# Patient Record
Sex: Male | Born: 1960 | Race: Black or African American | Hispanic: No | Marital: Single | Smoking: Never smoker
Health system: Southern US, Community
[De-identification: ages and names within clinical notes are randomized; demographics above are authoritative.]

## PROBLEM LIST (undated history)

## (undated) DIAGNOSIS — N189 Chronic kidney disease, unspecified: Secondary | ICD-10-CM

## (undated) DIAGNOSIS — I10 Essential (primary) hypertension: Secondary | ICD-10-CM

## (undated) DIAGNOSIS — R7303 Prediabetes: Secondary | ICD-10-CM

## (undated) DIAGNOSIS — M1990 Unspecified osteoarthritis, unspecified site: Secondary | ICD-10-CM

## (undated) DIAGNOSIS — E349 Endocrine disorder, unspecified: Secondary | ICD-10-CM

## (undated) DIAGNOSIS — S76319A Strain of muscle, fascia and tendon of the posterior muscle group at thigh level, unspecified thigh, initial encounter: Secondary | ICD-10-CM

## (undated) DIAGNOSIS — N289 Disorder of kidney and ureter, unspecified: Secondary | ICD-10-CM

## (undated) HISTORY — DX: Strain of muscle, fascia and tendon of the posterior muscle group at thigh level, unspecified thigh, initial encounter: S76.319A

## (undated) HISTORY — DX: Disorder of kidney and ureter, unspecified: N28.9

## (undated) HISTORY — DX: Unspecified osteoarthritis, unspecified site: M19.90

## (undated) HISTORY — DX: Endocrine disorder, unspecified: E34.9

## (undated) HISTORY — PX: NO PAST SURGERIES: SHX2092

## (undated) HISTORY — DX: Essential (primary) hypertension: I10

---

## 2019-09-02 ENCOUNTER — Other Ambulatory Visit: Payer: Self-pay

## 2019-09-03 ENCOUNTER — Ambulatory Visit: Payer: 59 | Admitting: Family Medicine

## 2019-09-03 ENCOUNTER — Encounter: Payer: Self-pay | Admitting: Family Medicine

## 2019-09-03 VITALS — BP 130/80 | HR 81 | Temp 98.0°F | Ht 68.0 in | Wt 186.2 lb

## 2019-09-03 DIAGNOSIS — Z1211 Encounter for screening for malignant neoplasm of colon: Secondary | ICD-10-CM

## 2019-09-03 DIAGNOSIS — Z23 Encounter for immunization: Secondary | ICD-10-CM

## 2019-09-03 DIAGNOSIS — I10 Essential (primary) hypertension: Secondary | ICD-10-CM | POA: Insufficient documentation

## 2019-09-03 DIAGNOSIS — R7989 Other specified abnormal findings of blood chemistry: Secondary | ICD-10-CM | POA: Insufficient documentation

## 2019-09-03 MED ORDER — AMLODIPINE BESYLATE 10 MG PO TABS: 10.0000 mg | ORAL_TABLET | Freq: Every day | ORAL | 1 refills | Status: DC

## 2019-09-03 NOTE — Patient Instructions (Signed)
Consider looking into xyosted testosterone injections. Can visit website to learn more about this and look into savings coupon.

## 2019-09-03 NOTE — Progress Notes (Signed)
Max [name] DOB: [birth year] Encounter date: 09/03/2019  This isa 59 y.o. male who presents to establish care. Chief Complaint  Patient presents with  . Establish Care    History of present illness: Sees provider in [place] who is NP and retiring soon; so is here to establish care.  Does testosterone injections weekly from [facility].   Has harder time in summer with allergies - starts coughing/chest congestion. Used albuterol and flonase and those have helped.   Has been on amlodipine 10mg  daily x 4-5 years. No swelling in legs.   Has torn hamstring and tens is helping with this. Had to stop taking anti-inflammatory due to effects on kidney. Taking tylenol if needed for pain.  Injecting self with testosterone. Didn't get benefit with topical cream testosterone treatment.    Does feel like testosterone helps with energy levels. Has long shifts, works nights. When it wears off he feels more tired.   Drinks little coffee in morning; tries to drink a lot of water. Has had a couple of episodes of dehydration in the past with syncope so really works on this now.   Never had colonoscopy completed. Not sure about last tetanus shot.   Past Medical History:  Diagnosis Date  . Abnormal renal function    states labs were elevated due to taking ANSAIDS-he is treated by a urologist in [place]  . Arthritis   . Hamstring tear    left  . Hypertension   . Testosterone deficiency    treated by [facility] [place]   History reviewed. No pertinent surgical history. Allergies  Allergen Reactions  . Ibuprofen     Affects kidney function significantly   Current Meds  Medication Sig  . acetaminophen (TYLENOL) 500 MG tablet Take 500 mg by mouth as needed.  [facility] amLODipine (NORVASC) 10 MG tablet Take 1 tablet (10 mg total) by mouth daily.  . cholecalciferol (VITAMIN D3) 25 MCG (1000 UNIT) tablet Take 1,000 Units by mouth daily.  . fluticasone (FLONASE) 50 MCG/ACT nasal spray Place into  both nostrils daily.  [facility] PRESCRIPTION MEDICATION Testosterone injection-self administered once a week  . tadalafil (CIALIS) 5 MG tablet Take 5 mg by mouth daily.  . traZODone (DESYREL) 50 MG tablet Take 50-100 mg by mouth at bedtime as needed.  . [DISCONTINUED] amLODipine (NORVASC) 10 MG tablet Take 10 mg by mouth daily.   Social History   Tobacco Use  . Smoking status: Never Smoker  . Smokeless tobacco: Never Used  Substance Use Topics  . Alcohol use: Not Currently    Comment: quit at age 24   Family History  Problem Relation Age of Onset  . Hypertension Mother   . Hypertension Father   . Bowel Disease Father        obstruction  . Diabetes Sister   . Hypertension Sister   . Hypertension Sister      Review of Systems  Constitutional: Negative for chills, fatigue and fever.  Respiratory: Negative for cough, chest tightness, shortness of breath and wheezing.   Cardiovascular: Negative for chest pain, palpitations and leg swelling.    Objective:  BP 130/80 (BP Location: Right Arm, Patient Position: Sitting, Cuff Size: Normal)   Pulse 81   Temp 98 F (36.7 C) (Temporal)   Ht 5\' 8"  (1.727 m)   Wt 186 lb 3.2 oz (84.5 kg)   SpO2 96%   BMI 28.31 kg/m   Weight: 186 lb 3.2 oz (84.5 kg)   BP Readings from Last 3  Encounters:  09/03/19 130/80   Wt Readings from Last 3 Encounters:  09/03/19 186 lb 3.2 oz (84.5 kg)    Physical Exam Constitutional:      General: He is not in acute distress.    Appearance: He is well-developed.  Cardiovascular:     Rate and Rhythm: Normal rate and regular rhythm.     Heart sounds: Normal heart sounds. No murmur. No friction rub.  Pulmonary:     Effort: Pulmonary effort is normal. No respiratory distress.     Breath sounds: Normal breath sounds. No wheezing or rales.  Musculoskeletal:     Right lower leg: No edema.     Left lower leg: No edema.  Neurological:     Mental Status: He is alert and oriented to person, place, and time.   Psychiatric:        Behavior: Behavior normal.     Assessment/Plan: 1. Essential hypertension Well controlled.   2. Low testosterone Getting shots through [facility].   Tdap completed today since due Return for pending record review.  Micheline Rough, MD

## 2019-09-06 ENCOUNTER — Telehealth: Payer: 59 | Admitting: Family Medicine

## 2019-09-10 ENCOUNTER — Encounter: Payer: Self-pay | Admitting: Family Medicine

## 2019-09-10 ENCOUNTER — Telehealth (INDEPENDENT_AMBULATORY_CARE_PROVIDER_SITE_OTHER): Payer: 59 | Admitting: Family Medicine

## 2019-09-10 DIAGNOSIS — J4520 Mild intermittent asthma, uncomplicated: Secondary | ICD-10-CM | POA: Diagnosis not present

## 2019-09-10 DIAGNOSIS — R7989 Other specified abnormal findings of blood chemistry: Secondary | ICD-10-CM | POA: Diagnosis not present

## 2019-09-10 DIAGNOSIS — J45909 Unspecified asthma, uncomplicated: Secondary | ICD-10-CM | POA: Insufficient documentation

## 2019-09-10 MED ORDER — SPACER/AERO-HOLDING CHAMBERS DEVI
1.0000 | Freq: Every day | 0 refills | Status: DC | PRN
Start: 2019-09-10 — End: 2022-01-18

## 2019-09-10 NOTE — Progress Notes (Signed)
Virtual Visit via Video Note  I connected with Max [name]  on 09/10/19 at 10:30 AM EDT by a video enabled telemedicine application and verified that I am speaking with the correct person using two identifiers.  Location patient: home Location provider:work or home office Persons participating in the virtual visit: patient, provider  I discussed the limitations of evaluation and management by telemedicine and the availability of in person appointments. The patient expressed understanding and agreed to proceed.   Max [name] DOB: July 10, 1960 Encounter date: 09/10/2019  This is a 59 y.o. male who presents with Chief Complaint  Patient presents with  . Cough    non productive x2 months, states ge feels like this every year due to allergies, used codeine type cough syrup he had from last year    History of present illness: Cough not as bad, but still dry cough. Has been using albuterol 2 puffs just if needed. Does feel like sx are getting better overall. No fevers, chills. Has used albuterol intermittently in the past usually during allergy season to help with recurring cough.  Was using old codeine cough syrup.  This was helpful, but he does not feel that he needs this anymore.  Never been on controller inhaler in the past.  Occasionally feels that he is somewhat wheezy.  Not significantly short of breath.  Not been dx with asthma in past. More notes with trying to laugh. Never had lung function testing.     Allergies  Allergen Reactions  . Ibuprofen     Affects kidney function significantly   No outpatient medications have been marked as taking for the 09/10/19 encounter (Video Visit) with Caren Macadam, MD.    Review of Systems  Constitutional: Negative for chills, fatigue and fever.  Respiratory: Negative for cough, chest tightness, shortness of breath and wheezing.   Cardiovascular: Negative for chest pain, palpitations and leg swelling.     Objective:  There were no vitals taken for this visit.      BP Readings from Last 3 Encounters:  09/03/19 130/80   Wt Readings from Last 3 Encounters:  09/03/19 186 lb 3.2 oz (84.5 kg)    EXAM:  GENERAL: alert, oriented, appears well and in no acute distress  HEENT: atraumatic, conjunctiva clear, no obvious abnormalities on inspection of external nose and ears  NECK: normal movements of the head and neck  LUNGS: on inspection no signs of respiratory distress, breathing rate appears normal, no obvious gross SOB, gasping or wheezing. Occasional dry cough.   CV: no obvious cyanosis  MS: moves all visible extremities without noticeable abnormality  PSYCH/NEURO: pleasant and cooperative, no obvious depression or anxiety, speech and thought processing grossly intact   Assessment/Plan  1. Mild intermittent extrinsic asthma without complication Symptoms are improving.  I have sent in a spacing chamber for him to use with his albuterol which should help this be more effective for him.  I do not feel he needs to have a nebulizer machine at this time, although previous provider had sent in medication for a nebulizer machine for him (he never used this).  We discussed that if cough continues, we can consider adding on a controller inhaler, or perhaps Singulair.  He will update me next week with symptoms.  He does not feel that he needs any cough syrup anymore since cough has improved.  2. Low testosterone He would like to transition care perhaps [facility] [place] for testosterone treatments.  He does  have a follow-up appointment with them next week and I have asked him to get blood work from them so that we can review.  He is interested in doing topical testosterone treatment so I have asked him to look into insurance coverage for this.  Of note, he did get the Cologuard and has it at home and will complete when able.    Return if symptoms worsen or fail to improve, for (and  mychart update next week).   I discussed the assessment and treatment plan with the patient. The patient was provided an opportunity to ask questions and all were answered. The patient agreed with the plan and demonstrated an understanding of the instructions.   The patient was advised to call back or seek an in-person evaluation if the symptoms worsen or if the condition fails to improve as anticipated.  I provided 15 minutes of non-face-to-face time during this encounter.   Theodis Shove, MD

## 2019-09-29 LAB — COLOGUARD
COLOGUARD: NEGATIVE
Cologuard: NEGATIVE

## 2019-09-30 ENCOUNTER — Encounter: Payer: Self-pay | Admitting: Family Medicine

## 2019-10-15 ENCOUNTER — Encounter: Payer: Self-pay | Admitting: Family Medicine

## 2019-10-22 ENCOUNTER — Encounter: Payer: Self-pay | Admitting: Family Medicine

## 2019-10-23 ENCOUNTER — Other Ambulatory Visit: Payer: Self-pay | Admitting: Family Medicine

## 2019-10-23 MED ORDER — TADALAFIL 5 MG PO TABS: 5.0000 mg | ORAL_TABLET | Freq: Every day | ORAL | 5 refills | Status: DC

## 2019-11-03 ENCOUNTER — Other Ambulatory Visit: Payer: Self-pay | Admitting: Family Medicine

## 2019-11-03 ENCOUNTER — Telehealth: Payer: Self-pay | Admitting: Family Medicine

## 2019-11-03 DIAGNOSIS — I10 Essential (primary) hypertension: Secondary | ICD-10-CM

## 2019-11-03 DIAGNOSIS — R7989 Other specified abnormal findings of blood chemistry: Secondary | ICD-10-CM

## 2019-11-03 DIAGNOSIS — Z13220 Encounter for screening for lipoid disorders: Secondary | ICD-10-CM

## 2019-11-03 NOTE — Telephone Encounter (Signed)
Labs ordered.

## 2019-11-03 NOTE — Telephone Encounter (Signed)
Pt would like to get routine labs done and need lab orders entered to schedule appt. Pt was seen on 4/23 as new pt.

## 2019-11-04 NOTE — Telephone Encounter (Signed)
Spoke with the pt and scheduled a lab appt for 6/25 per pts preference.

## 2019-11-05 ENCOUNTER — Other Ambulatory Visit: Payer: Self-pay

## 2019-11-05 ENCOUNTER — Other Ambulatory Visit (INDEPENDENT_AMBULATORY_CARE_PROVIDER_SITE_OTHER): Payer: 59

## 2019-11-05 ENCOUNTER — Other Ambulatory Visit: Payer: 59

## 2019-11-05 DIAGNOSIS — Z13220 Encounter for screening for lipoid disorders: Secondary | ICD-10-CM

## 2019-11-05 DIAGNOSIS — R7989 Other specified abnormal findings of blood chemistry: Secondary | ICD-10-CM | POA: Diagnosis not present

## 2019-11-05 DIAGNOSIS — I10 Essential (primary) hypertension: Secondary | ICD-10-CM

## 2019-11-05 LAB — LIPID PANEL
Cholesterol: 189 mg/dL (ref 0–200)
HDL: 49.6 mg/dL (ref >39.00)
LDL Cholesterol: 125 mg/dL — ABNORMAL HIGH (ref 0–99)
NonHDL: 139.32
Total CHOL/HDL Ratio: 4
Triglycerides: 74 mg/dL (ref 0.0–149.0)
VLDL: 14.8 mg/dL (ref 0.0–40.0)

## 2019-11-05 LAB — CBC WITH DIFFERENTIAL/PLATELET
Basophils Absolute: 0.1 10*3/uL (ref 0.0–0.1)
Basophils Relative: 0.9 % (ref 0.0–3.0)
Eosinophils Absolute: 0.1 10*3/uL (ref 0.0–0.7)
Eosinophils Relative: 0.9 % (ref 0.0–5.0)
HCT: 41.4 % (ref 39.0–52.0)
Hemoglobin: 13.5 g/dL (ref 13.0–17.0)
Lymphocytes Relative: 27.6 % (ref 12.0–46.0)
Lymphs Abs: 1.7 10*3/uL (ref 0.7–4.0)
MCHC: 32.6 g/dL (ref 30.0–36.0)
MCV: 76.7 fl — ABNORMAL LOW (ref 78.0–100.0)
Monocytes Absolute: 0.6 10*3/uL (ref 0.1–1.0)
Monocytes Relative: 9.7 % (ref 3.0–12.0)
Neutro Abs: 3.8 10*3/uL (ref 1.4–7.7)
Neutrophils Relative %: 60.9 % (ref 43.0–77.0)
Platelets: 353 10*3/uL (ref 150.0–400.0)
RBC: 5.4 Mil/uL (ref 4.22–5.81)
RDW: 17.1 % — ABNORMAL HIGH (ref 11.5–15.5)
WBC: 6.3 10*3/uL (ref 4.0–10.5)

## 2019-11-05 LAB — COMPREHENSIVE METABOLIC PANEL
ALT: 27 U/L (ref 0–53)
AST: 23 U/L (ref 0–37)
Albumin: 4.5 g/dL (ref 3.5–5.2)
Alkaline Phosphatase: 52 U/L (ref 39–117)
BUN: 17 mg/dL (ref 6–23)
CO2: 27 mEq/L (ref 19–32)
Calcium: 9.5 mg/dL (ref 8.4–10.5)
Chloride: 103 mEq/L (ref 96–112)
Creatinine, Ser: 1.42 mg/dL (ref 0.40–1.50)
GFR: 62.02 mL/min (ref >60.00)
Glucose, Bld: 85 mg/dL (ref 70–99)
Potassium: 4 mEq/L (ref 3.5–5.1)
Sodium: 140 mEq/L (ref 135–145)
Total Bilirubin: 0.3 mg/dL (ref 0.2–1.2)
Total Protein: 6.8 g/dL (ref 6.0–8.3)

## 2019-11-05 LAB — TESTOSTERONE: Testosterone: 1223.1 ng/dL — ABNORMAL HIGH (ref 300.00–890.00)

## 2019-11-05 LAB — PSA: PSA: 1.91 ng/mL (ref 0.10–4.00)

## 2019-11-10 ENCOUNTER — Encounter: Payer: Self-pay | Admitting: Family Medicine

## 2019-11-18 ENCOUNTER — Telehealth: Payer: Self-pay | Admitting: Family Medicine

## 2019-11-18 NOTE — Telephone Encounter (Signed)
Pt stated he spoke to his insurance and that they will cover the medication for Testerone. He is wondering if he can do the injection medication instead of the gel?   Pt can be reached at [phone] or my-chart

## 2019-11-19 ENCOUNTER — Encounter: Payer: Self-pay | Admitting: Family Medicine

## 2019-11-19 NOTE — Telephone Encounter (Signed)
Can you check with him and see if he is referring to testosterone injection (testosterone cypionate) or xyosted (self injector testosterone enanthate that you weekly inject). I believe we discussed both and xyosted is newer and more expensive, so just want to confirm. Thanks!

## 2019-11-19 NOTE — Telephone Encounter (Signed)
The patient called back stating that [facility] is where he needs his testosterone sent for a 3 months supply.  Cypionate - injection 100 mL is probably what he is taking 200 mL is probably what he needs  amLODipine (NORVASC) 10 MG tablet  He also needs estrogine pills sent to [facility]  [facility] [phone] Fax: [phone]

## 2019-11-19 NOTE — Telephone Encounter (Signed)
Spoke with the pt and informed him of the message below.  Patient seemed to be confused regarding the medication and I gave him the name of the Rx for Xosted to call his insurance to check for coverage. Patient agreed to call back with information and message sent to PCP.

## 2019-11-19 NOTE — Telephone Encounter (Signed)
Noted  

## 2019-11-19 NOTE — Telephone Encounter (Signed)
No answer when I called. I sent mychart message.

## 2019-11-19 NOTE — Telephone Encounter (Signed)
Pt would like to have a call to discuss the medication he stated that he called his insurance and he could get it through them but would like to discuss it with you.

## 2019-11-20 MED ORDER — AMLODIPINE BESYLATE 10 MG PO TABS: 10.0000 mg | ORAL_TABLET | Freq: Every day | ORAL | 3 refills | Status: DC

## 2019-11-20 MED ORDER — TESTOSTERONE CYPIONATE 200 MG/ML IM SOLN: 200.0000 mg | INTRAMUSCULAR | 3 refills | Status: AC

## 2019-11-22 ENCOUNTER — Encounter: Payer: Self-pay | Admitting: Family Medicine

## 2019-11-24 ENCOUNTER — Other Ambulatory Visit: Payer: Self-pay | Admitting: Family Medicine

## 2019-11-24 MED ORDER — TRAZODONE HCL 50 MG PO TABS: 50.0000 mg | ORAL_TABLET | Freq: Every evening | ORAL | 1 refills | Status: DC | PRN

## 2019-11-30 ENCOUNTER — Telehealth: Payer: Self-pay | Admitting: *Deleted

## 2019-11-30 NOTE — Telephone Encounter (Signed)
Prior auth for Testosterone cypionate 200mg /ml was sent to [email or web address]- [email or web address].

## 2019-12-01 NOTE — Telephone Encounter (Signed)
Fax received from [facility] stating the request was denied and letter given to PCP.

## 2019-12-02 ENCOUNTER — Telehealth: Payer: Self-pay | Admitting: *Deleted

## 2019-12-02 ENCOUNTER — Telehealth: Payer: Self-pay | Admitting: Family Medicine

## 2019-12-02 ENCOUNTER — Other Ambulatory Visit: Payer: Self-pay | Admitting: Family Medicine

## 2019-12-02 MED ORDER — XYOSTED 75 MG/0.5ML ~~LOC~~ SOAJ: 75.0000 mg | SUBCUTANEOUS | 5 refills | Status: DC

## 2019-12-02 NOTE — Telephone Encounter (Signed)
Spoke with the pt and informed him the printed  Rx for Max [name] was faxed to [facility] at [phone].

## 2019-12-02 NOTE — Telephone Encounter (Signed)
His insurance is denying testosterone injections.  They require 2 abnormally low testosterone levels that were checked prior to starting treatment.  Does he have blood work from [facility] where he was getting testosterone replacement?  And does he know if they did 2 separate checks for low testosterone?  If he did not have this done, his insurance will not approve testosterone as he does not meet the other requirements that they state (like having both testicles removed).  Our options are to recheck a baseline level off of medication to prove that he is low.  We could also consider sending to urology as sometimes specialty opinion will make a difference for coverage.  If he has prior blood work that demonstrates low testosterone, we can simply submit this back to insurance for him.  He had some interest in [place], but with this not being covered, I am not certain that we are going to have any luck with any partial coverage of the [place] and even with co-pay benefits, I feel that this will likely be too expensive for him, but he is certainly able to check out pricing (I messaged him about this and printed prescription for [facility] that he could pick up and advised him to check their website for savings card).

## 2019-12-03 ENCOUNTER — Encounter: Payer: Self-pay | Admitting: Family Medicine

## 2019-12-03 NOTE — Telephone Encounter (Signed)
Message sent to patient

## 2019-12-03 NOTE — Telephone Encounter (Signed)
Spoke with the pt and informed him of the message below.  Patient stated he recalls having one test that was low and around 300.  Patient stated he already has a urologist and I advised he contact their office as below due to this being their specialty and he agreed.

## 2019-12-14 ENCOUNTER — Telehealth: Payer: Self-pay | Admitting: *Deleted

## 2019-12-14 NOTE — Telephone Encounter (Signed)
[facility] faxed a refill request for a 90-day supply ofTadalafil.  Request sent to PCP.

## 2019-12-15 MED ORDER — TADALAFIL 5 MG PO TABS: 5.0000 mg | ORAL_TABLET | Freq: Every day | ORAL | 0 refills | Status: DC

## 2019-12-15 NOTE — Telephone Encounter (Signed)
Rx done. 

## 2019-12-15 NOTE — Telephone Encounter (Signed)
Yes that is fine

## 2020-01-05 ENCOUNTER — Encounter: Payer: Self-pay | Admitting: Family Medicine

## 2020-01-07 ENCOUNTER — Ambulatory Visit: Payer: 59 | Admitting: Family Medicine

## 2020-01-07 ENCOUNTER — Encounter: Payer: Self-pay | Admitting: Family Medicine

## 2020-01-07 NOTE — Telephone Encounter (Signed)
Spoke with the pt and informed him of the message below. Appt scheduled for today at 1:30pm with Dr Clent Ridges as PCP does not have any openings.

## 2020-01-27 ENCOUNTER — Other Ambulatory Visit: Payer: Self-pay | Admitting: Family Medicine

## 2020-02-04 ENCOUNTER — Telehealth (INDEPENDENT_AMBULATORY_CARE_PROVIDER_SITE_OTHER): Payer: 59 | Admitting: Family Medicine

## 2020-02-04 ENCOUNTER — Encounter: Payer: Self-pay | Admitting: Family Medicine

## 2020-02-04 VITALS — Temp 97.5°F

## 2020-02-04 DIAGNOSIS — J0100 Acute maxillary sinusitis, unspecified: Secondary | ICD-10-CM | POA: Diagnosis not present

## 2020-02-04 NOTE — Progress Notes (Signed)
Virtual Visit via Video Note  I connected with Max [name]  on 02/04/20 at 11:30 AM EDT by a video enabled telemedicine application and verified that I am speaking with the correct person using two identifiers.  Location patient: home Location provider: [facility]  [address] [place], [place] [address] Persons participating in the virtual visit: patient, provider  I discussed the limitations of evaluation and management by telemedicine and the availability of in person appointments. The patient expressed understanding and agreed to proceed.   Max [name] DOB: [birth year] Encounter date: 02/04/2020  This is a 59 y.o. male who presents with Chief Complaint  Patient presents with  . Sinus Problem    patient complains of sinus congestion x1 week, tried Sudafed D  . Cough    non-productive x1 week    History of present illness: Issues with sinus/cough last week or so. Taking sudafed and cough medicine. Still dry cough; eyes watering a lot. Does feel better now than he did earlier in the week. Couple weeks ago had fever and did have cold sx.   No fevers.   Hasn't been tested for COVID.   Feels better since he made appointment.  Not short of breath or wheezy. Cough is hacking, but decreased than it was a few days ago.   No diarrhea or vomiting.   Appetite is doing better. Last week not tasting for a day or so but better now.   Allergies  Allergen Reactions  . Ibuprofen     Affects kidney function significantly   Current Meds  Medication Sig  . acetaminophen (TYLENOL) 500 MG tablet Take 500 mg by mouth as needed.  [facility] amLODipine (NORVASC) 10 MG tablet Take 1 tablet (10 mg total) by mouth daily.  . cholecalciferol (VITAMIN D3) 25 MCG (1000 UNIT) tablet Take 1,000 Units by mouth daily.  . fluticasone (FLONASE) 50 MCG/ACT nasal spray Place into both nostrils daily.  [facility] Spacer/Aero-Holding Chambers DEVI 1 Device by Does not apply route daily as needed.   . tadalafil (CIALIS) 5 MG tablet TAKE 1 TABLET BY MOUTH  DAILY  . testosterone cypionate (DEPOTESTOSTERONE CYPIONATE) 200 MG/ML injection Inject 1 mL (200 mg total) into the muscle once a week.  . traZODone (DESYREL) 50 MG tablet Take 1-2 tablets (50-100 mg total) by mouth at bedtime as needed.    Review of Systems  Constitutional: Negative for chills and fever.  HENT: Positive for congestion (improving), postnasal drip and sinus pressure. Negative for ear pain, sinus pain and sore throat.   Respiratory: Positive for cough. Negative for shortness of breath and wheezing.   Cardiovascular: Negative for chest pain.    Objective:  Temp (!) 97.5 F (36.4 C)       BP Readings from Last 3 Encounters:  09/03/19 130/80   Wt Readings from Last 3 Encounters:  09/03/19 186 lb 3.2 oz (84.5 kg)    EXAM:  GENERAL: alert, oriented, appears well and in no acute distress  HEENT: atraumatic, conjunctiva clear, no obvious abnormalities on inspection of external nose and ears  NECK: normal movements of the head and neck  LUNGS: on inspection no signs of respiratory distress, breathing rate appears normal, no obvious gross SOB, gasping or wheezing  CV: no obvious cyanosis  MS: moves all visible extremities without noticeable abnormality  PSYCH/NEURO: pleasant and cooperative, no obvious depression or anxiety, speech and thought processing grossly intact   Assessment/Plan  1. Acute non-recurrent maxillary sinusitis He is feeling  better than earlier in week when he made appointment.  He will continue to monitor symptoms and treat symptomatically at this point.  We discussed Mucinex to help break up congestion.  He will let me know if any worsening of symptoms, but I do not think further intervention is warranted given improvement in the last 2 days.    I discussed the assessment and treatment plan with the patient. The patient was provided an opportunity to ask questions and all were  answered. The patient agreed with the plan and demonstrated an understanding of the instructions.   The patient was advised to call back or seek an in-person evaluation if the symptoms worsen or if the condition fails to improve as anticipated.  I provided  15 minutes of non-face-to-face time during this encounter.   Theodis Shove, MD

## 2020-02-08 MED ORDER — BENZONATATE 100 MG PO CAPS: 100.0000 mg | ORAL_CAPSULE | Freq: Three times a day (TID) | ORAL | 0 refills | Status: DC | PRN

## 2020-02-16 ENCOUNTER — Encounter: Payer: Self-pay | Admitting: Family Medicine

## 2020-04-12 ENCOUNTER — Ambulatory Visit (INDEPENDENT_AMBULATORY_CARE_PROVIDER_SITE_OTHER): Payer: 59 | Admitting: Family Medicine

## 2020-04-12 ENCOUNTER — Encounter: Payer: Self-pay | Admitting: Family Medicine

## 2020-04-12 ENCOUNTER — Other Ambulatory Visit: Payer: Self-pay

## 2020-04-12 VITALS — HR 62 | Temp 98.1°F | Ht 68.5 in | Wt 182.9 lb

## 2020-04-12 DIAGNOSIS — Z862 Personal history of diseases of the blood and blood-forming organs and certain disorders involving the immune mechanism: Secondary | ICD-10-CM

## 2020-04-12 DIAGNOSIS — J4520 Mild intermittent asthma, uncomplicated: Secondary | ICD-10-CM

## 2020-04-12 DIAGNOSIS — E1169 Type 2 diabetes mellitus with other specified complication: Secondary | ICD-10-CM

## 2020-04-12 DIAGNOSIS — R7989 Other specified abnormal findings of blood chemistry: Secondary | ICD-10-CM | POA: Diagnosis not present

## 2020-04-12 DIAGNOSIS — Z0000 Encounter for general adult medical examination without abnormal findings: Secondary | ICD-10-CM

## 2020-04-12 DIAGNOSIS — Z23 Encounter for immunization: Secondary | ICD-10-CM | POA: Diagnosis not present

## 2020-04-12 DIAGNOSIS — E785 Hyperlipidemia, unspecified: Secondary | ICD-10-CM

## 2020-04-12 DIAGNOSIS — S76312S Strain of muscle, fascia and tendon of the posterior muscle group at thigh level, left thigh, sequela: Secondary | ICD-10-CM

## 2020-04-12 DIAGNOSIS — I10 Essential (primary) hypertension: Secondary | ICD-10-CM | POA: Diagnosis not present

## 2020-04-12 DIAGNOSIS — G4700 Insomnia, unspecified: Secondary | ICD-10-CM

## 2020-04-12 DIAGNOSIS — R454 Irritability and anger: Secondary | ICD-10-CM

## 2020-04-12 MED ORDER — ADULT BLOOD PRESSURE CUFF LG KIT: 1.0000 | PACK | Freq: Every day | 0 refills | Status: AC | PRN

## 2020-04-12 MED ORDER — VALSARTAN 40 MG PO TABS: 40.0000 mg | ORAL_TABLET | Freq: Every day | ORAL | 1 refills | Status: DC

## 2020-04-12 MED ORDER — TRAZODONE HCL 50 MG PO TABS: 50.0000 mg | ORAL_TABLET | Freq: Every evening | ORAL | 1 refills | Status: DC | PRN

## 2020-04-12 MED ORDER — SERTRALINE HCL 25 MG PO TABS: 25.0000 mg | ORAL_TABLET | Freq: Every day | ORAL | 1 refills | Status: DC

## 2020-04-12 NOTE — Addendum Note (Signed)
Addended by: Johnella Moloney on: 04/12/2020 10:44 AM   Modules accepted: Orders

## 2020-04-12 NOTE — Addendum Note (Signed)
Addended by: Lerry Liner on: 04/12/2020 10:06 AM   Modules accepted: Orders

## 2020-04-12 NOTE — Progress Notes (Signed)
Max [name] DOB: [birth year] Encounter date: 04/12/2020  This is a 59 y.o. male who presents for complete physical   History of present illness/Additional concerns: Has been having headaches. Not sure if related to too much caffeine. Not sure if related to bp. NP started amlodipine to help with pressure and he felt better once this was started. Not checking bp at home.   Hypertension: Amlodipine 10 mg daily  Insomnia: Trazodone 50 to 100 mg nightly as needed. Did run out of this - bought otc zquil  But not sure if that actually helps. Works 12 hour shifts; sleeps just 6 hours. Did sleep better with the trazodone.   Has dry eyes - burn, turn red. Has to use the artificial tears. Sees eye doc regularly. Has had more twitching of left eye.   Vitamin D deficiency: 1000 units daily.  Low testosterone: 200 mg weekly injections. Took injection this morning. If he gets off testosterone he feels tired, doesn't feel well. Goes to [facility] q 3 months.   Cologuard completed 09/29/2019 and was negative.  Sometimes gets aggravated very easily - stressed.   Still recovering from left hamstring pull - had evaluation for this including MRI. He is working on stretching; low weight curls.   Past Medical History:  Diagnosis Date  . Abnormal renal function    states labs were elevated due to taking ANSAIDS-he is treated by a urologist in [place]  . Arthritis   . Hamstring tear    left  . Hypertension   . Testosterone deficiency    treated by [facility],[place]   History reviewed. No pertinent surgical history. Allergies  Allergen Reactions  . Ibuprofen     Affects kidney function significantly   Current Meds  Medication Sig  . acetaminophen (TYLENOL) 500 MG tablet Take 500 mg by mouth as needed.  Marland Kitchen amLODipine (NORVASC) 10 MG tablet Take 1 tablet (10 mg total) by mouth daily.  . cholecalciferol (VITAMIN D3) 25 MCG (1000 UNIT) tablet Take 1,000 Units by mouth daily.  .  fluticasone (FLONASE) 50 MCG/ACT nasal spray Place into both nostrils daily.  Marland Kitchen Spacer/Aero-Holding Chambers DEVI 1 Device by Does not apply route daily as needed.  . tadalafil (CIALIS) 5 MG tablet TAKE 1 TABLET BY MOUTH  DAILY  . testosterone cypionate (DEPOTESTOSTERONE CYPIONATE) 200 MG/ML injection Inject 1 mL (200 mg total) into the muscle once a week.  . traZODone (DESYREL) 50 MG tablet Take 1-2 tablets (50-100 mg total) by mouth at bedtime as needed.  . [DISCONTINUED] traZODone (DESYREL) 50 MG tablet Take 1-2 tablets (50-100 mg total) by mouth at bedtime as needed.   Social History   Tobacco Use  . Smoking status: Never Smoker  . Smokeless tobacco: Never Used  Substance Use Topics  . Alcohol use: Not Currently    Comment: quit at age 89   Family History  Problem Relation Age of Onset  . Hypertension Mother   . Hypertension Father   . Bowel Disease Father        obstruction  . Diabetes Sister   . Hypertension Sister   . Hypertension Sister      Review of Systems  Constitutional: Negative for activity change, appetite change, chills, fatigue, fever and unexpected weight change.  HENT: Negative for congestion, ear pain, hearing loss, sinus pressure, sinus pain, sore throat and trouble swallowing.   Eyes: Negative for pain and visual disturbance.  Respiratory: Negative for cough, chest tightness, shortness of breath and wheezing.  Cardiovascular: Negative for chest pain, palpitations and leg swelling.  Gastrointestinal: Negative for abdominal distention, abdominal pain, blood in stool, constipation, diarrhea, nausea and vomiting.  Genitourinary: Negative for decreased urine volume, difficulty urinating, dysuria, penile pain and testicular pain.  Musculoskeletal: Negative for arthralgias, back pain and joint swelling.  Skin: Negative for rash.  Neurological: Negative for dizziness, weakness, numbness and headaches.  Hematological: Negative for adenopathy. Does not  bruise/bleed easily.  Psychiatric/Behavioral: Negative for agitation, sleep disturbance and suicidal ideas. The patient is not nervous/anxious.    Depression screen PHQ 2/9 04/12/2020  Decreased Interest 0  Down, Depressed, Hopeless 1  PHQ - 2 Score 1    CBC:  Lab Results  Component Value Date   WBC 6.3 11/05/2019   HGB 13.5 11/05/2019   HCT 41.4 11/05/2019   MCHC 32.6 11/05/2019   RDW 17.1 (H) 11/05/2019   PLT 353.0 11/05/2019   CMP: Lab Results  Component Value Date   NA 140 11/05/2019   K 4.0 11/05/2019   CL 103 11/05/2019   CO2 27 11/05/2019   GLUCOSE 85 11/05/2019   BUN 17 11/05/2019   CREATININE 1.42 11/05/2019   CALCIUM 9.5 11/05/2019   PROT 6.8 11/05/2019   BILITOT 0.3 11/05/2019   ALKPHOS 52 11/05/2019   ALT 27 11/05/2019   AST 23 11/05/2019   LIPID: Lab Results  Component Value Date   CHOL 189 11/05/2019   TRIG 74.0 11/05/2019   HDL 49.60 11/05/2019   LDLCALC 125 (H) 11/05/2019    Objective:  Pulse 62   Temp 98.1 F (36.7 C) (Oral)   Ht 5' 8.5" (1.74 m)   Wt 182 lb 14.4 oz (83 kg)   BMI 27.41 kg/m   Weight: 182 lb 14.4 oz (83 kg)   BP Readings from Last 3 Encounters:  09/03/19 130/80   Wt Readings from Last 3 Encounters:  04/12/20 182 lb 14.4 oz (83 kg)  09/03/19 186 lb 3.2 oz (84.5 kg)    Physical Exam Constitutional:      General: He is not in acute distress.    Appearance: He is well-developed.  HENT:     Head: Normocephalic and atraumatic.     Right Ear: External ear normal.     Left Ear: External ear normal.     Nose: Nose normal.     Mouth/Throat:     Pharynx: No oropharyngeal exudate.  Eyes:     Conjunctiva/sclera: Conjunctivae normal.     Pupils: Pupils are equal, round, and reactive to light.  Neck:     Thyroid: No thyromegaly.  Cardiovascular:     Rate and Rhythm: Normal rate and regular rhythm.     Heart sounds: Normal heart sounds. No murmur heard.  No friction rub. No gallop.   Pulmonary:     Effort: Pulmonary  effort is normal. No respiratory distress.     Breath sounds: Normal breath sounds. No stridor. No wheezing or rales.  Abdominal:     General: Bowel sounds are normal.     Palpations: Abdomen is soft.  Musculoskeletal:        General: Normal range of motion.     Cervical back: Neck supple.  Skin:    General: Skin is warm and dry.  Neurological:     Mental Status: He is alert and oriented to person, place, and time.  Psychiatric:        Behavior: Behavior normal.        Thought Content: Thought content normal.  Judgment: Judgment normal.     Assessment/Plan: Health Maintenance Due  Topic Date Due  . INFLUENZA VACCINE  12/12/2019   Health Maintenance reviewed.  1. Preventative health care Discussed importance of regular exercise, stretching. He would like to cut back on caffeine, salt which are good goals.  2. Primary hypertension Continue with amlodipine 24m; add valsartan 462mdaily. - valsartan (DIOVAN) 40 MG tablet; Take 1 tablet (40 mg total) by mouth daily.  Dispense: 90 tablet; Refill: 1 - Basic metabolic panel; Future - Blood Pressure Monitoring (ADULT BLOOD PRESSURE CUFF LG) KIT; 1 Device by Does not apply route daily as needed.  Dispense: 1 kit; Refill: 0  3. Mild intermittent extrinsic asthma without complication Has been well controlled.   4. Low testosterone Follows with [facility].  5. Strain of left hamstring, sequela Stretches daily; don't overlift!  6. Insomnia, unspecified type Sleeps well w trazodone. - traZODone (DESYREL) 50 MG tablet; Take 1-2 tablets (50-100 mg total) by mouth at bedtime as needed.  Dispense: 90 tablet; Refill: 1  7. Irritability Start this for trial for irritability; will follow up in 1 mo. - sertraline (ZOLOFT) 25 MG tablet; Take 1 tablet (25 mg total) by mouth daily.  Dispense: 90 tablet; Refill: 1  8. History of anemia - Iron, TIBC and Ferritin Panel; Future  9. Hyperlipidemia associated with type 2 diabetes mellitus  (HCMcCarr- Lipid panel; Future   Return in about 1 month (around 05/13/2020) for Chronic condition visit - follow up on irritability/blood pressure.  JuMicheline RoughMD

## 2020-04-12 NOTE — Patient Instructions (Addendum)
For the irritability trial of zoloft. This was sent in for you.   Track your blood pressure at home and record.    Hamstring Strain Rehab Ask your health care provider which exercises are safe for you. Do exercises exactly as told by your health care provider and adjust them as directed. It is normal to feel mild stretching, pulling, tightness, or discomfort as you do these exercises. Stop right away if you feel sudden pain or your pain gets worse. Do not begin these exercises until told by your health care provider. Stretching and range-of-motion exercises These exercises warm up your muscles and joints and improve the movement and flexibility of your thighs. These exercises also help to relieve pain, numbness, and tingling. Talk to your health care provider about these restrictions. Knee extension, seated  1. Sit with your left / right heel propped on a chair, a coffee table, or a footstool. Do not have anything under your knee to support it. 2. Allow your leg muscles to relax, letting gravity straighten out your knee (extension). You should feel a stretch behind your left / right knee. 3. If told by your health care provider, deepen the stretch by placing a __________ weight on your thigh, just above your kneecap. 4. Hold this position for __________ seconds. Repeat __________ times. Complete this exercise __________ times a day. Seated stretch This exercise is sometimes called hamstrings and adductors stretch. 1. Sit on the floor with your legs stretched wide. Keep your knees straight during this exercise. 2. Keeping your head and back in a straight line, bend at your waist to reach for your left foot (position A). You should feel a stretch in your right inner thigh (adductors). 3. Hold this position for __________ seconds. Then slowly return to the upright position. 4. Keeping your head and back in a straight line, bend at your waist to reach forward (position B). You should feel a stretch  behind both of your thighs or knees (hamstrings). 5. Hold this position for __________ seconds. Then slowly return to the upright position. 6. Keeping your head and back in a straight line, bend at your waist to reach for your right foot (position C). You should feel a stretch in your left inner thigh (adductors). 7. Hold this position for __________ seconds. Then slowly return to the upright position. Repeat __________ times. Complete this exercise __________ times a day. Hamstrings stretch, supine  1. Lie on your back (supine position). 2. Loop a belt or towel over the ball of your left / right foot. The ball of your foot is on the walking surface, right under your toes. 3. Straighten your left / right knee and slowly pull on the belt or towel to raise your leg. ? Do not let your left / right knee bend while you do this. ? Keep your other leg flat on the floor. ? Raise the left / right leg until you feel a gentle stretch behind your left / right knee or thigh (hamstrings). 4. Hold this position for __________ seconds. 5. Slowly return your leg to the starting position. Repeat __________ times. Complete this exercise __________ times a day. Strengthening exercises These exercises build strength and endurance in your thighs. Endurance is the ability to use your muscles for a long time, even after they get tired. Straight leg raises, prone This exercise strengthens the muscles that move the hips (hip extensors). 1. Lie on your abdomen on a firm surface (prone position). 2. Tense the muscles in  your buttocks and lift your left / right leg about 4 inches (10 cm). Keep your knee straight as you lift your leg. If you cannot lift your leg that high without arching your back, place a pillow under your hips. 3. Hold the position for __________ seconds. 4. Slowly lower your leg to the starting position. 5. Allow your muscles to relax completely before you start the next repetition. Repeat __________  times. Complete this exercise __________ times a day. Bridge This exercise strengthens the muscles in your buttocks and the back of your thighs (hip extensors). 1. Lie on your back on a firm surface with your knees bent and your feet flat on the floor. 2. Tighten your buttocks muscles and lift your bottom off the floor until the trunk of your body is level with your thighs. ? You should feel the muscles working in your buttocks and the back of your thighs. ? Do not arch your back. 3. Hold this position for __________ seconds. 4. Slowly lower your hips to the starting position. 5. Let your buttocks muscles relax completely between repetitions. 6. If told by your health care provider, keep your bottom lifted off the floor while you slowly walk your feet away from you as far as you can control. Hold for __________ seconds, then slowly walk your feet back toward you. Repeat __________ times. Complete this exercise __________ times a day. Lateral walking with band This is an exercise in which you walk sideways (lateral), with tension provided by an exercise band. The exercise strengthens the muscles in your hip (hip abductors). 1. Stand in a long hallway. 2. Wrap a loop of exercise band around your legs, just above your knees. 3. Bend your knees gently and drop your hips down and back so your weight is over your heels. 4. Step to the side to move down the length of the hallway, keeping your toes pointed ahead of you and keeping tension in the band. 5. Repeat, leading with your other leg. Repeat __________ times. Complete this exercise __________ times a day. Single leg stand with reaching This exercise is also called eccentric hamstring stretch. 1. Stand on your left / right foot. Keep your big toe down on the floor and try to keep your arch lifted. 2. Slowly reach down toward the floor as far as you can while keeping your balance. Lowering your thigh under tension is called eccentric  stretching. 3. Hold this position for __________ seconds. Repeat __________ times. Complete this exercise __________ times a day. Plank, prone This exercise strengthens muscles in your abdomen and core area. 1. Lie on your abdomen on the floor (prone position),and prop yourself up on your elbows. Your hands should be straight out in front of you, and your elbows should be below your shoulders. Position your feet similar to a push-up position so your toes are on the ground. 2. Tighten your abdominal muscles and lift your body off the floor. ? Do not arch your back. ? Do not hold your breath. 3. Hold this position for __________ seconds. Repeat __________ times. Complete this exercise __________ times a day. This information is not intended to replace advice given to you by your health care provider. Make sure you discuss any questions you have with your health care provider. Document Revised: 08/20/2018 Document Reviewed: 04/27/2018 Elsevier Patient Education  2020 [employer].

## 2020-04-13 ENCOUNTER — Other Ambulatory Visit: Payer: Self-pay | Admitting: Family Medicine

## 2020-04-13 LAB — BASIC METABOLIC PANEL
BUN: 17 mg/dL (ref 7–25)
CO2: 29 mmol/L (ref 20–32)
Calcium: 10.4 mg/dL — ABNORMAL HIGH (ref 8.6–10.3)
Chloride: 104 mmol/L (ref 98–110)
Creat: 1.33 mg/dL (ref 0.70–1.33)
Glucose, Bld: 96 mg/dL (ref 65–99)
Potassium: 5.4 mmol/L — ABNORMAL HIGH (ref 3.5–5.3)
Sodium: 144 mmol/L (ref 135–146)

## 2020-04-13 LAB — IRON,TIBC AND FERRITIN PANEL
%SAT: 26 % (calc) (ref 20–48)
Ferritin: 14 ng/mL — ABNORMAL LOW (ref 38–380)
Iron: 94 ug/dL (ref 50–180)
TIBC: 360 mcg/dL (calc) (ref 250–425)

## 2020-04-13 LAB — LIPID PANEL
Cholesterol: 207 mg/dL — ABNORMAL HIGH (ref <200)
HDL: 58 mg/dL (ref >=40)
LDL Cholesterol (Calc): 131 mg/dL (calc) — ABNORMAL HIGH
Non-HDL Cholesterol (Calc): 149 mg/dL (calc) — ABNORMAL HIGH (ref <130)
Total CHOL/HDL Ratio: 3.6 (calc) (ref <5.0)
Triglycerides: 82 mg/dL (ref <150)

## 2020-04-19 ENCOUNTER — Other Ambulatory Visit: Payer: Self-pay | Admitting: Family Medicine

## 2020-04-19 DIAGNOSIS — E875 Hyperkalemia: Secondary | ICD-10-CM

## 2020-04-26 ENCOUNTER — Other Ambulatory Visit (INDEPENDENT_AMBULATORY_CARE_PROVIDER_SITE_OTHER): Payer: 59

## 2020-04-26 ENCOUNTER — Other Ambulatory Visit: Payer: Self-pay

## 2020-04-26 DIAGNOSIS — E875 Hyperkalemia: Secondary | ICD-10-CM | POA: Diagnosis not present

## 2020-04-27 LAB — BASIC METABOLIC PANEL
BUN: 14 mg/dL (ref 7–25)
CO2: 29 mmol/L (ref 20–32)
Calcium: 9.6 mg/dL (ref 8.6–10.3)
Chloride: 102 mmol/L (ref 98–110)
Creat: 1.32 mg/dL (ref 0.70–1.33)
Glucose, Bld: 92 mg/dL (ref 65–99)
Potassium: 4.3 mmol/L (ref 3.5–5.3)
Sodium: 139 mmol/L (ref 135–146)

## 2020-07-02 ENCOUNTER — Other Ambulatory Visit: Payer: Self-pay | Admitting: Family Medicine

## 2020-08-24 ENCOUNTER — Other Ambulatory Visit: Payer: Self-pay | Admitting: Family Medicine

## 2020-09-01 ENCOUNTER — Other Ambulatory Visit: Payer: Self-pay | Admitting: *Deleted

## 2020-09-01 DIAGNOSIS — I10 Essential (primary) hypertension: Secondary | ICD-10-CM

## 2020-09-01 MED ORDER — VALSARTAN 40 MG PO TABS: 40.0000 mg | ORAL_TABLET | Freq: Every day | ORAL | 0 refills | Status: DC

## 2020-09-01 NOTE — Telephone Encounter (Signed)
Rx done. 

## 2020-09-21 ENCOUNTER — Other Ambulatory Visit: Payer: Self-pay | Admitting: Family Medicine

## 2020-09-26 ENCOUNTER — Telehealth: Payer: Self-pay | Admitting: Family Medicine

## 2020-09-26 NOTE — Telephone Encounter (Signed)
The patient is requesting for a new Rx for Losartin 25 Mg sent to the pharmacy instead of valsartan (DIOVAN) 40 MG tablet because it is a lower cost.  [facility] - [place], [place] - [address], [address] Phone:  [phone]  Fax:  [phone]

## 2020-09-27 ENCOUNTER — Other Ambulatory Visit: Payer: Self-pay | Admitting: Family Medicine

## 2020-09-27 DIAGNOSIS — I10 Essential (primary) hypertension: Secondary | ICD-10-CM

## 2020-09-27 DIAGNOSIS — R454 Irritability and anger: Secondary | ICD-10-CM

## 2020-09-28 ENCOUNTER — Other Ambulatory Visit: Payer: Self-pay | Admitting: Family Medicine

## 2020-09-28 MED ORDER — LOSARTAN POTASSIUM 25 MG PO TABS: 25.0000 mg | ORAL_TABLET | Freq: Every day | ORAL | 3 refills | Status: DC

## 2020-09-28 NOTE — Telephone Encounter (Signed)
Form also sent to be scanned. 

## 2020-09-28 NOTE — Telephone Encounter (Signed)
Rxss savings solution form faxed to [facility] at [phone].

## 2020-09-28 NOTE — Telephone Encounter (Signed)
I just returned papers to you for this, just to make sure he was on board. Since he is; disregard my note and I will send in the losartan instead of diovan.

## 2020-10-14 ENCOUNTER — Other Ambulatory Visit: Payer: Self-pay | Admitting: Family Medicine

## 2020-10-18 ENCOUNTER — Encounter: Payer: Self-pay | Admitting: Family Medicine

## 2020-10-25 ENCOUNTER — Other Ambulatory Visit: Payer: Self-pay | Admitting: Family Medicine

## 2020-10-25 DIAGNOSIS — R454 Irritability and anger: Secondary | ICD-10-CM

## 2020-11-03 ENCOUNTER — Other Ambulatory Visit: Payer: Self-pay | Admitting: Family Medicine

## 2020-11-03 DIAGNOSIS — G4700 Insomnia, unspecified: Secondary | ICD-10-CM

## 2020-11-20 ENCOUNTER — Encounter: Payer: Self-pay | Admitting: Family Medicine

## 2020-11-29 ENCOUNTER — Encounter: Payer: Self-pay | Admitting: Family Medicine

## 2020-11-29 NOTE — Telephone Encounter (Signed)
MRI reports received and given to PCP for review.

## 2020-11-30 ENCOUNTER — Other Ambulatory Visit: Payer: Self-pay | Admitting: Family Medicine

## 2020-11-30 DIAGNOSIS — M25552 Pain in left hip: Secondary | ICD-10-CM

## 2020-12-02 ENCOUNTER — Other Ambulatory Visit: Payer: Self-pay | Admitting: Family Medicine

## 2020-12-02 DIAGNOSIS — I10 Essential (primary) hypertension: Secondary | ICD-10-CM

## 2020-12-03 NOTE — Telephone Encounter (Signed)
Refill request for valsartan. Losartan also on med list. Shouldn't be on both. Overdue for follow up visit. Please calrify med list. Make sure he has enough to get to next visit.

## 2020-12-05 NOTE — Telephone Encounter (Signed)
Spoke with the patient and he stated he is taking Valsartan and not Losartan.  Appt was scheduled for August 5th and he stated he has enough pills to last until the follow up visit.

## 2020-12-11 ENCOUNTER — Other Ambulatory Visit: Payer: Self-pay | Admitting: Family Medicine

## 2020-12-14 ENCOUNTER — Encounter: Payer: Self-pay | Admitting: Family Medicine

## 2020-12-14 ENCOUNTER — Other Ambulatory Visit: Payer: Self-pay

## 2020-12-15 ENCOUNTER — Encounter: Payer: Self-pay | Admitting: Family Medicine

## 2020-12-15 ENCOUNTER — Ambulatory Visit: Payer: 59 | Admitting: Family Medicine

## 2020-12-15 VITALS — BP 122/60 | HR 69 | Temp 97.6°F | Ht 68.5 in | Wt 188.7 lb

## 2020-12-15 DIAGNOSIS — M25552 Pain in left hip: Secondary | ICD-10-CM

## 2020-12-15 DIAGNOSIS — R454 Irritability and anger: Secondary | ICD-10-CM

## 2020-12-15 DIAGNOSIS — E611 Iron deficiency: Secondary | ICD-10-CM

## 2020-12-15 DIAGNOSIS — E538 Deficiency of other specified B group vitamins: Secondary | ICD-10-CM | POA: Diagnosis not present

## 2020-12-15 DIAGNOSIS — I10 Essential (primary) hypertension: Secondary | ICD-10-CM | POA: Diagnosis not present

## 2020-12-15 LAB — COMPREHENSIVE METABOLIC PANEL
ALT: 31 U/L (ref 0–53)
AST: 25 U/L (ref 0–37)
Albumin: 4.1 g/dL (ref 3.5–5.2)
Alkaline Phosphatase: 58 U/L (ref 39–117)
BUN: 13 mg/dL (ref 6–23)
CO2: 25 mEq/L (ref 19–32)
Calcium: 9 mg/dL (ref 8.4–10.5)
Chloride: 102 mEq/L (ref 96–112)
Creatinine, Ser: 1.29 mg/dL (ref 0.40–1.50)
GFR: 60.98 mL/min (ref >60.00)
Glucose, Bld: 83 mg/dL (ref 70–99)
Potassium: 3.8 mEq/L (ref 3.5–5.1)
Sodium: 138 mEq/L (ref 135–145)
Total Bilirubin: 0.4 mg/dL (ref 0.2–1.2)
Total Protein: 6.4 g/dL (ref 6.0–8.3)

## 2020-12-15 LAB — CBC WITH DIFFERENTIAL/PLATELET
Basophils Absolute: 0 10*3/uL (ref 0.0–0.1)
Basophils Relative: 0.7 % (ref 0.0–3.0)
Eosinophils Absolute: 0.1 10*3/uL (ref 0.0–0.7)
Eosinophils Relative: 1.4 % (ref 0.0–5.0)
HCT: 46.1 % (ref 39.0–52.0)
Hemoglobin: 15.5 g/dL (ref 13.0–17.0)
Lymphocytes Relative: 29.6 % (ref 12.0–46.0)
Lymphs Abs: 1.9 10*3/uL (ref 0.7–4.0)
MCHC: 33.6 g/dL (ref 30.0–36.0)
MCV: 91.6 fl (ref 78.0–100.0)
Monocytes Absolute: 0.7 10*3/uL (ref 0.1–1.0)
Monocytes Relative: 10.3 % (ref 3.0–12.0)
Neutro Abs: 3.8 10*3/uL (ref 1.4–7.7)
Neutrophils Relative %: 58 % (ref 43.0–77.0)
Platelets: 277 10*3/uL (ref 150.0–400.0)
RBC: 5.04 Mil/uL (ref 4.22–5.81)
RDW: 14.5 % (ref 11.5–15.5)
WBC: 6.5 10*3/uL (ref 4.0–10.5)

## 2020-12-15 LAB — FERRITIN: Ferritin: 44.8 ng/mL (ref 22.0–322.0)

## 2020-12-15 LAB — FOLATE: Folate: >24.4 ng/mL (ref >5.9)

## 2020-12-15 LAB — SEDIMENTATION RATE: Sed Rate: 7 mm/hr (ref 0–20)

## 2020-12-15 LAB — VITAMIN B12: Vitamin B-12: 675 pg/mL (ref 211–911)

## 2020-12-15 MED ORDER — SERTRALINE HCL 25 MG PO TABS: 25.0000 mg | ORAL_TABLET | Freq: Every day | ORAL | 3 refills | Status: DC

## 2020-12-15 MED ORDER — AMLODIPINE BESYLATE 10 MG PO TABS: 10.0000 mg | ORAL_TABLET | Freq: Every day | ORAL | 3 refills | Status: DC

## 2020-12-15 NOTE — Progress Notes (Signed)
Max [name] DOB: [birth year] Encounter date: 12/15/2020  This is a 60 y.o. male who presents with Chief Complaint  Patient presents with   Follow-up    History of present illness:  Has appointment next week with Dr. Tamala Julian. 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 Stays sore. Used to work legs at gym, not doing this now. 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   Going to stick with valsartan as this is less expensive. Getting [facility].   Mother who is 95 fell backward last night and he had to help her up.   Allergies  Allergen Reactions   Ibuprofen     Affects kidney function significantly   Current Meds  Medication Sig   acetaminophen (TYLENOL) 500 MG tablet Take 500 mg by mouth as needed.   Blood Pressure Monitoring (ADULT BLOOD PRESSURE CUFF LG) KIT 1 Device by Does not apply route daily as needed.   cholecalciferol (VITAMIN D3) 25 MCG (1000 UNIT) tablet Take 1,000 Units by mouth daily.   fluticasone (FLONASE) 50 MCG/ACT nasal spray Place into both nostrils daily.   Spacer/Aero-Holding Chambers DEVI 1 Device by Does not apply route daily as needed.   tadalafil (CIALIS) 5 MG tablet TAKE 1 TABLET BY MOUTH  DAILY   testosterone cypionate (DEPOTESTOSTERONE CYPIONATE) 200 MG/ML injection Inject 1 mL (200 mg total) into the muscle once a week.   traZODone (DESYREL) 50 MG tablet TAKE 1 TO 2 TABLETS BY MOUTH AT BEDTIME AS NEEDED   valsartan (DIOVAN) 40 MG tablet Take 40 mg by mouth daily.   [DISCONTINUED] amLODipine (NORVASC) 10 MG tablet TAKE 1 TABLET BY MOUTH  DAILY   [DISCONTINUED] sertraline (ZOLOFT) 25 MG tablet TAKE 1 TABLET BY MOUTH EVERY DAY    Review of Systems  Constitutional:  Negative for chills, fatigue and fever.  Respiratory:  Negative for cough, chest tightness, shortness of breath and wheezing.   Cardiovascular:  Negative for chest pain, palpitations and leg swelling.   Objective:  BP 122/60 (BP Location: Left Arm,  Patient Position: Sitting, Cuff Size: Large)   Pulse 69   Temp 97.6 F (36.4 C) (Oral)   Ht 5' 8.5" (1.74 m)   Wt 188 lb 11.2 oz (85.6 kg)   BMI 28.27 kg/m   Weight: 188 lb 11.2 oz (85.6 kg)   BP Readings from Last 3 Encounters:  12/15/20 122/60  09/03/19 130/80   Wt Readings from Last 3 Encounters:  12/15/20 188 lb 11.2 oz (85.6 kg)  04/12/20 182 lb 14.4 oz (83 kg)  09/03/19 186 lb 3.2 oz (84.5 kg)    Physical Exam Constitutional:      General: He is not in acute distress.    Appearance: He is well-developed.  Cardiovascular:     Rate and Rhythm: Normal rate and regular rhythm.     Heart sounds: Normal heart sounds. No murmur heard.   No friction rub.  Pulmonary:     Effort: Pulmonary effort is normal. No respiratory distress.     Breath sounds: Normal breath sounds. No wheezing or rales.  Musculoskeletal:     Right lower leg: No edema.     Left lower leg: No edema.  Neurological:     Mental Status: He is alert and oriented to person, place, and time.  Psychiatric:        Behavior: Behavior normal.    Assessment/Plan  1. Irritability Has been stable with this.  -  sertraline (ZOLOFT) 25 MG tablet; Take 1 tablet (25 mg total) by mouth daily.  Dispense: 90 tablet; Refill: 3  2. Primary hypertension Well controlled with current medications. He will continue with amlodipine and valsartan 57m.  - amLODipine (NORVASC) 10 MG tablet; Take 1 tablet (10 mg total) by mouth daily.  Dispense: 90 tablet; Refill: 3 - CBC with Differential/Platelet; Future - Comprehensive metabolic panel; Future  3. Left hip pain Seeing sports med next week.  - Sedimentation rate; Future  4. Iron deficiency Will work on dietary supplement.  - Ferritin; Future   Return in about 6 months (around 06/17/2021) for physical exam.     JMicheline Rough MD

## 2020-12-15 NOTE — Addendum Note (Signed)
Addended by: Kandra Nicolas on: 12/15/2020 02:12 PM   Modules accepted: Orders

## 2020-12-18 ENCOUNTER — Other Ambulatory Visit: Payer: Self-pay | Admitting: *Deleted

## 2020-12-18 ENCOUNTER — Other Ambulatory Visit: Payer: Self-pay | Admitting: Family Medicine

## 2020-12-18 DIAGNOSIS — R454 Irritability and anger: Secondary | ICD-10-CM

## 2020-12-18 DIAGNOSIS — G4700 Insomnia, unspecified: Secondary | ICD-10-CM

## 2020-12-18 MED ORDER — TRAZODONE HCL 50 MG PO TABS: 50.0000 mg | ORAL_TABLET | Freq: Every evening | ORAL | 3 refills | Status: DC | PRN

## 2020-12-18 MED ORDER — SERTRALINE HCL 25 MG PO TABS: 25.0000 mg | ORAL_TABLET | Freq: Every day | ORAL | 1 refills | Status: DC

## 2020-12-18 NOTE — Telephone Encounter (Signed)
[facility] faxed a refill request for Sertraline and Trazodone.  Refill sent on Sertraline and message sent to PCP for Trazodone refill.

## 2020-12-28 ENCOUNTER — Encounter: Payer: Self-pay | Admitting: Family Medicine

## 2020-12-28 ENCOUNTER — Other Ambulatory Visit: Payer: Self-pay

## 2020-12-28 ENCOUNTER — Ambulatory Visit (INDEPENDENT_AMBULATORY_CARE_PROVIDER_SITE_OTHER): Payer: 59

## 2020-12-28 ENCOUNTER — Ambulatory Visit: Payer: Self-pay

## 2020-12-28 ENCOUNTER — Ambulatory Visit: Payer: 59 | Admitting: Family Medicine

## 2020-12-28 VITALS — BP 120/80 | HR 83 | Ht 68.5 in | Wt 188.0 lb

## 2020-12-28 DIAGNOSIS — M25552 Pain in left hip: Secondary | ICD-10-CM

## 2020-12-28 NOTE — Assessment & Plan Note (Signed)
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  Patient does have very limited internal range of motion.  Discussed with patient about the possibility of different treatment options.  Patient is considering the possibility of surgical intervention but will with formal physical therapy.  Patient was accompanied with significant other and all questions were answered.  Discussed also the possibility of different medications as well as possible injections.  Patient would rather start with the physical therapy and see me again in 6 to 8 weeks to see how patient responds.  If worsening pain may need to consider referral to orthopedic surgery.

## 2020-12-28 NOTE — Patient Instructions (Signed)
PT  will call you See me again in 6-8 weeks

## 2020-12-28 NOTE — Progress Notes (Signed)
[place] Phone: [phone] Subjective:   Fontaine No, am serving as a scribe for Dr. Hulan Saas. This visit occurred during the SARS-CoV-2 public health emergency.  Safety protocols were in place, including screening questions prior to the visit, additional usage of staff PPE, and extensive cleaning of exam room while observing appropriate contact time as indicated for disinfecting solutions.   I'm seeing this patient by the request  of:  Caren Macadam, MD  CC: hip pain   [id number]  Max [name] is a 60 y.o. male coming in with complaint of left hip pain. He is having pain in L hip flexor and groin. Suffered hamstring tear in 2020. 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 Painful with hip IR and ER.  Patient states that it seems to be getting worse.  Walks with a limp.  Patient did have a MRI in 2020.  Patient did bring in the disc.  Does seem that patient actually had more of an obturator tendinitis did not truly a hamstring tear.     Past Medical History:  Diagnosis Date   Abnormal renal function    states labs were elevated due to taking ANSAIDS-he is treated by a urologist in [place]    Hamstring tear    left   Hypertension    Testosterone deficiency    treated by [facility] MD-[place]   No past surgical history on file. Social History   Socioeconomic History   Marital status: Single    Spouse name: Not on file   Number of children: Not on file   Years of education: Not on file   Highest education level: Not on file  Occupational History   Not on file  Tobacco Use   Smoking status: Never   Smokeless tobacco: Never  Vaping Use   Vaping Use: Never used  Substance and Sexual Activity   Alcohol use: Not Currently    Comment: quit at age 59   Drug use: Never   Sexual activity: Yes  Other Topics Concern   Not on file   Social History Narrative   Not on file   Social Determinants of Health   Financial Resource Strain: Not on file  Food Insecurity: Not on file  Transportation Needs: Not on file  Physical Activity: Not on file  Stress: Not on file  Social Connections: Not on file   Allergies  Allergen Reactions   Ibuprofen     Affects kidney function significantly   Family History  Problem Relation Age of Onset   Hypertension Mother    Hypertension Father    Bowel Disease Father        obstruction   Diabetes Sister    Hypertension Sister    Hypertension Sister     Current Outpatient Medications (Endocrine & Metabolic):    testosterone cypionate (DEPOTESTOSTERONE CYPIONATE) 200 MG/ML injection, Inject 1 mL (200 mg total) into the muscle once a week.  Current Outpatient Medications (Cardiovascular):    amLODipine (NORVASC) 10 MG tablet, Take 1 tablet (10 mg total) by mouth daily.   tadalafil (CIALIS) 5 MG tablet, TAKE 1 TABLET BY MOUTH  DAILY   valsartan (DIOVAN) 40 MG tablet, Take 40 mg by mouth daily.  Current Outpatient Medications (Respiratory):    fluticasone (FLONASE) 50 MCG/ACT nasal spray, Place into both nostrils daily.  Current Outpatient Medications (Analgesics):  acetaminophen (TYLENOL) 500 MG tablet, Take 500 mg by mouth as needed.   Current Outpatient Medications (Other):    Blood Pressure Monitoring (ADULT BLOOD PRESSURE CUFF LG) KIT, 1 Device by Does not apply route daily as needed.   cholecalciferol (VITAMIN D3) 25 MCG (1000 UNIT) tablet, Take 1,000 Units by mouth daily.   sertraline (ZOLOFT) 25 MG tablet, Take 1 tablet (25 mg total) by mouth daily.   Spacer/Aero-Holding Dorise Bullion, 1 Device by Does not apply route daily as needed.   traZODone (DESYREL) 50 MG tablet, Take 1-2 tablets (50-100 mg total) by mouth at bedtime as needed.   Reviewed prior external information including notes and imaging from  primary care provider As well as notes that were available  from care everywhere and other healthcare systems.  Past medical history, social, surgical and family history all reviewed in electronic medical record.  No pertanent information unless stated regarding to the chief complaint.   Review of Systems:  No headache, visual changes, nausea, vomiting, diarrhea, constipation, dizziness, abdominal pain, skin rash, fevers, chills, night sweats, weight loss, swollen lymph nodes, body aches, joint swelling, chest pain, shortness of breath, mood changes. POSITIVE muscle aches  Objective  Blood pressure 120/80, pulse 83, height 5' 8.5" (1.74 m), weight 188 lb (85.3 kg), SpO2 97 %.   General: No apparent distress alert and oriented x3 mood and affect normal, dressed appropriately.  HEENT: Pupils equal, extraocular movements intact  Respiratory: Patient's speak in full sentences and does not appear short of breath  Cardiovascular: No lower extremity edema, non tender, no erythema  Gait antalgic favoring the left hip Patient's left hip shows the patient does have some significant decrease in internal range of motion with only 5 degrees.  Does have tightness noted with FABER test as well.  Patient has a negative straight leg test.  No pain in the back.  Neurovascularly intact distally.  Right hip has 10 to 15 degrees of internal rotation which is an improvement from the contralateral side.   Impression and Recommendations:     The above documentation has been reviewed and is accurate and complete Lyndal Pulley, DO

## 2021-01-08 ENCOUNTER — Encounter: Payer: Self-pay | Admitting: Family Medicine

## 2021-01-12 ENCOUNTER — Other Ambulatory Visit: Payer: Self-pay

## 2021-01-12 ENCOUNTER — Encounter: Payer: Self-pay | Admitting: Physical Therapy

## 2021-01-12 ENCOUNTER — Ambulatory Visit: Payer: 59 | Attending: Family Medicine | Admitting: Physical Therapy

## 2021-01-12 DIAGNOSIS — M25552 Pain in left hip: Secondary | ICD-10-CM | POA: Diagnosis not present

## 2021-01-12 DIAGNOSIS — M25652 Stiffness of left hip, not elsewhere classified: Secondary | ICD-10-CM | POA: Diagnosis present

## 2021-01-12 NOTE — Therapy (Signed)
[facility] [address] [place], [place], [address] Phone: [phone]   Fax:  [phone]  Physical Therapy Evaluation  Patient Details  Name: Max [name] MRN: [id number] Date of Birth: 11-06-1960 Referring Provider (PT): Antoine Primas   Encounter Date: 01/12/2021   PT End of Session - 01/12/21 1352     Visit Number 1    Number of Visits 6    Date for PT Re-Evaluation 02/23/21    PT Start Time 1020    PT Stop Time 1048    PT Time Calculation (min) 28 min    Activity Tolerance Patient tolerated treatment well    Behavior During Therapy [facility] for tasks assessed/performed             Past Medical History:  Diagnosis Date   Abnormal renal function    states labs were elevated due to taking ANSAIDS-he is treated by a urologist in [place]   Arthritis    Hamstring tear    left   Hypertension    Testosterone deficiency    treated by [facility] [place]    History reviewed. No pertinent surgical history.  There were no vitals filed for this visit.    Subjective Assessment - 01/12/21 1326     Subjective COVID-19 screen performed prior to patient entering clinic.  The patient presents to the clinic with c/o left hip pain since about 2020.  This is also the year he tore a left hamstring.  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  He is considering having a total hip replacement.  His pain at rest today was a 4/10.    Pertinent History HTN, left hamstring tear.    How long can you walk comfortably? Variable.    Diagnostic tests X-ray.    Patient Stated Goals Workout and do job without pain.    Currently in Pain? Yes    Pain Score 4     Pain Location Hip    Pain Orientation Left    Pain Descriptors / Indicators Aching    Pain Type Chronic pain    Pain Radiating Towards Left groin region.    Pain Onset More than a month ago    Pain Frequency  Constant    Aggravating Factors  See above.    Pain Relieving Factors Rest.                [facility] PT Assessment - 01/12/21 0001       Assessment   Medical Diagnosis Left hip pain.    Referring Provider (PT) Antoine Primas    Onset Date/Surgical Date --   2020     Precautions   Precautions None      Restrictions   Weight Bearing Restrictions No      Balance Screen   Has the patient fallen in the past 6 months No    Has the patient had a decrease in activity level because of a fear of falling?  No    Is the patient reluctant to leave their home because of a fear of falling?  No      Home Environment   Living Environment Private residence      Prior Function   Level of Independence Independent      ROM / Strength   AROM / PROM / Strength AROM;Strength      AROM   Overall AROM Comments Left hip IR  very limited to ~5 degrees.      Strength   Overall Strength Comments Normal left hip strength.      Palpation   Palpation comment Pain c/o left anterior hip pain but not palpable in nature.  He also has c/o left groin pain.      Special Tests   Other special tests Equal leg lengths.  Soem pain reproduction and limited movement with left FABER testing.      Ambulation/Gait   Gait Comments Mild limp upon initiation of gait cycle upon standing from chair.                        Objective measurements completed on examination: See above findings.                            Plan - 01/12/21 1403     Clinical Impression Statement The patient presents to OPPT with c/o left hip pain since 2020.  His left hip is very restricted especially into internal rotation.  He c/o pain in his anterior hip and groin.  His strength is normal.  He also had pain reproduction and limited range of motion with a FABER test.  Patient would like to have a few session of PT and also establish a HEP.  He is considering a toatl hip replacement.  Patient will  benefit from skilled physical therapy intervention to address pain and deficits.    Personal Factors and Comorbidities Other;Profession    Examination-Activity Limitations Other;Locomotion Level    Examination-Participation Restrictions Other    Stability/Clinical Decision Making Evolving/Moderate complexity    Clinical Decision Making Low    Rehab Potential Fair    PT Frequency 1x / week    PT Duration 6 weeks    PT Treatment/Interventions ADLs/Self Care Home Management;Functional mobility training;Therapeutic activities;Therapeutic exercise;Manual techniques;Patient/family education;Passive range of motion;Dry needling;Joint Manipulations;Electrical Stimulation    PT Next Visit Plan Left SKTC, recumbent bike, left hip stretching.  Manual techniques (ie:  Belt mobs).    Consulted and Agree with Plan of Care Patient             Patient will benefit from skilled therapeutic intervention in order to improve the following deficits and impairments:  Difficulty walking, Decreased activity tolerance, Decreased range of motion  Visit Diagnosis: Pain in left hip - Plan: PT plan of care cert/re-cert  Stiffness of left hip, not elsewhere classified - Plan: PT plan of care cert/re-cert     Problem List Patient Active Problem List   Diagnosis Date Noted   Left hip pain 12/28/2020   Allergy-induced asthma 09/10/2019   Hypertension 09/03/2019   Low testosterone 09/03/2019    Zeenat Jeanbaptiste, [place] MPT 01/12/2021, 2:26 PM  [facility] [address] [place], [place], [address] Phone: [phone]   Fax:  [phone]  Name: Saksham Akkerman MRN: [id number] Date of Birth: 03-03-1961

## 2021-01-15 ENCOUNTER — Encounter: Payer: Self-pay | Admitting: Family Medicine

## 2021-01-16 ENCOUNTER — Other Ambulatory Visit: Payer: Self-pay | Admitting: Family Medicine

## 2021-01-16 DIAGNOSIS — R454 Irritability and anger: Secondary | ICD-10-CM

## 2021-01-16 MED ORDER — VALSARTAN 40 MG PO TABS: 40.0000 mg | ORAL_TABLET | Freq: Every day | ORAL | 1 refills | Status: DC

## 2021-01-16 MED ORDER — SERTRALINE HCL 25 MG PO TABS: 25.0000 mg | ORAL_TABLET | Freq: Every day | ORAL | 1 refills | Status: DC

## 2021-01-17 ENCOUNTER — Ambulatory Visit: Payer: 59 | Admitting: Physical Therapy

## 2021-01-17 ENCOUNTER — Other Ambulatory Visit: Payer: Self-pay

## 2021-01-17 DIAGNOSIS — M25652 Stiffness of left hip, not elsewhere classified: Secondary | ICD-10-CM

## 2021-01-17 DIAGNOSIS — M25552 Pain in left hip: Secondary | ICD-10-CM | POA: Diagnosis not present

## 2021-01-17 NOTE — Therapy (Signed)
[facility] [address] [place], [place], [address] Phone: [phone]   Fax:  [phone]  Physical Therapy Treatment  Patient Details  Name: Pau Banh MRN: [id number] Date of Birth: 05-12-1961 Referring Provider (PT): Antoine Primas   Encounter Date: 01/17/2021   PT End of Session - 01/17/21 1303     Visit Number 2    Number of Visits 6    Date for PT Re-Evaluation 02/23/21    PT Start Time 0100    PT Stop Time 0140    PT Time Calculation (min) 40 min    Activity Tolerance Patient tolerated treatment well    Behavior During Therapy [facility] for tasks assessed/performed             Past Medical History:  Diagnosis Date   Abnormal renal function    states labs were elevated due to taking ANSAIDS-he is treated by a [place]rologist in [place]   Arthritis    Hamstring tear    left   Hypertension    Testosterone deficiency    treated by [facility] [place]    No past surgical history on file.  There were no vitals filed for this visit.   Subjective Assessment - 01/17/21 1259     Subjective COVID-19 screen performed prior to patient entering clinic.  Patient arrived with some ongoing pain in hip, has already done some exercises at gym    Pertinent History HTN, left hamstring tear.    How long can you walk comfortably? Variable.    Diagnostic tests X-ray.    Patient Stated Goals Workout and do job without pain.    Currently in Pain? Yes    Pain Score 7     Pain Location Hip    Pain Orientation Left    Pain Descriptors / Indicators Aching    Pain Type Chronic pain    Pain Onset More than a month ago    Pain Frequency Constant    Aggravating Factors  increased activity    Pain Relieving Factors rest                               [facility] Adult PT Treatment/Exercise - 01/17/21 0001       Exercises   Exercises Knee/Hip      Knee/Hip Exercises: Stretches   Active Hamstring Stretch Left;2  reps;30 seconds    Passive Hamstring Stretch Left;2 reps;30 seconds      Knee/Hip Exercises: Aerobic   Stationary Bike L1-2 x97min      Knee/Hip Exercises: Standing   Heel Raises Both;20 reps    Hip Abduction Stengthening;Left;20 reps;Knee straight    Abduction Limitations red band    Lateral Step Up Left;20 reps;Step Height: 4";10 reps    Forward Step Up Left;20 reps;Step Height: 6"    Wall Squat 20 reps    Rocker Board 3 minutes    Other Standing Knee Exercises lateral stepping with red band 2x fatigue      Knee/Hip Exercises: Supine   Bridges with Clamshell Strengthening;Both;20 reps    Straight Leg Raises Strengthening;Left;20 reps      Knee/Hip Exercises: Sidelying   Hip ABduction Strengthening;Left;2 sets;10 reps    Clams x20                  Upper Extremity Functional Index Score :   /80  Plan - 01/17/21 1334     Clinical Impression Statement Patient tolerated treatment well today. Patient able to progress with strength and stretching activities. Patient has increased pain with prolong activity. Patient limited with ADL's and work activity. Goals per PT.    Personal Factors and Comorbidities Other;Profession    Examination-Activity Limitations Other;Locomotion Level    Examination-Participation Restrictions Other    Stability/Clinical Decision Making Evolving/Moderate complexity    Rehab Potential Fair    PT Frequency 1x / week    PT Duration 6 weeks    PT Treatment/Interventions ADLs/Self Care Home Management;Functional mobility training;Therapeutic activities;Therapeutic exercise;Manual techniques;Patient/family education;Passive range of motion;Dry needling;Joint Manipulations;Electrical Stimulation    PT Next Visit Plan Left SKTC, recumbent bike, left hip stretching.  Manual techniques (ie:  Belt mobs).    Consulted and Agree with Plan of Care Patient             Patient will benefit from skilled therapeutic intervention in  order to improve the following deficits and impairments:  Difficulty walking, Decreased activity tolerance, Decreased range of motion  Visit Diagnosis: Pain in left hip  Stiffness of left hip, not elsewhere classified     Problem List Patient Active Problem List   Diagnosis Date Noted   Left hip pain 12/28/2020   Allergy-induced asthma 09/10/2019   Hypertension 09/03/2019   Low testosterone 09/03/2019    Ryle Buscemi P, PTA 01/17/2021, 1:40 PM  [facility] [address] [place], [place], [address] Phone: [phone]   Fax:  [phone]  Name: Jakye Mullens MRN: [id number] Date of Birth: [birth year]

## 2021-01-25 ENCOUNTER — Encounter: Payer: 59 | Admitting: Physical Therapy

## 2021-02-06 ENCOUNTER — Ambulatory Visit (INDEPENDENT_AMBULATORY_CARE_PROVIDER_SITE_OTHER): Payer: 59 | Admitting: Family Medicine

## 2021-02-06 ENCOUNTER — Other Ambulatory Visit: Payer: Self-pay

## 2021-02-06 ENCOUNTER — Encounter: Payer: Self-pay | Admitting: Family Medicine

## 2021-02-06 ENCOUNTER — Ambulatory Visit: Payer: Self-pay

## 2021-02-06 VITALS — BP 122/86 | HR 76 | Ht 68.5 in | Wt 189.0 lb

## 2021-02-06 DIAGNOSIS — M25552 Pain in left hip: Secondary | ICD-10-CM | POA: Diagnosis not present

## 2021-02-06 NOTE — Patient Instructions (Signed)
Dr. Doroteo Glassman office will call you I'm here for any questions

## 2021-02-06 NOTE — Assessment & Plan Note (Signed)
Known arthritic changes.  Failed conservative therapy with physical therapy and is having worsening pain.  Patient has very limited internal range of motion.  X-rays show moderate to severe arthritic changes.  Affecting daily activities and keeping him from working out as well as giving him difficulty with his job performance.  Will refer patient to orthopedic surgery to discuss the possibility of surgical intervention.  Can follow-up with me as needed

## 2021-02-06 NOTE — Progress Notes (Signed)
[place] Phone: [phone] Subjective:   Max [name], am serving as a scribe for Dr. Hulan [name].  This visit occurred during the SARS-CoV-2 public health emergency.  Safety protocols were in place, including screening questions prior to the visit, additional usage of staff PPE, and extensive cleaning of exam room while observing appropriate contact time as indicated for disinfecting solutions.    I'm seeing this patient by the request  of:  Max [name], Max Berg, MD  CC: Left hip pain follow-up  Max [name]:[id number]  12/28/2020 Patient does have moderate to severe arthritic changes of the hip noted on x-ray today.  Patient does have very limited internal range of motion.  Discussed with patient about the possibility of different treatment options.  Patient is considering the possibility of surgical intervention but will with formal physical therapy.  Patient was accompanied with significant other and all questions were answered.  Discussed also the possibility of different medications as well as possible injections.  Patient would rather start with the physical therapy and see me again in 6 to 8 weeks to see how patient responds.  If worsening pain may need to consider referral to orthopedic surgery.  Update 02/06/2021 Max [name] is a 60 y.o. male coming in with complaint of left hip pain. Patient has continued pain in L groin. Patient would like to discuss replacement surgery with surgeon.  Patient states that nothing seems to be helping and if anything it is worsening at this time.  Starting to affect daily activities as well.    Past Medical History:  Diagnosis Date   Abnormal renal function    states labs were elevated due to taking ANSAIDS-he is treated by a urologist in [place]    Hamstring tear    left   Hypertension    Testosterone deficiency    treated by [facility] [place]    [name] past surgical history on file. Social History   Socioeconomic History   Marital status: Single    Spouse name: Not on file   Number of children: Not on file   Years of education: Not on file   Highest education level: Not on file  Occupational History   Not on file  Tobacco Use   Smoking status: Never   Smokeless tobacco: Never  Vaping Use   Vaping Use: Never used  Substance and Sexual Activity   Alcohol use: Not Currently    Comment: quit at age 40   Drug use: Never   Sexual activity: Yes  Other Topics Concern   Not on file  Social History Narrative   Not on file   Social Determinants of Health   Financial Resource Strain: Not on file  Food Insecurity: Not on file  Transportation Needs: Not on file  Physical Activity: Not on file  Stress: Not on file  Social Connections: Not on file   Allergies  Allergen Reactions   Ibuprofen     Affects kidney function significantly   Family History  Problem Relation Age of Onset   Hypertension Mother    Hypertension Father    Bowel Disease Father        obstruction   Diabetes Sister    Hypertension Sister    Hypertension Sister     Current Outpatient Medications (Endocrine & Metabolic):    testosterone cypionate (DEPOTESTOSTERONE CYPIONATE) 200 MG/ML injection, Inject 1 mL (200 mg total) into the muscle once a  week.  Current Outpatient Medications (Cardiovascular):    amLODipine (NORVASC) 10 MG tablet, Take 1 tablet (10 mg total) by mouth daily.   tadalafil (CIALIS) 5 MG tablet, TAKE 1 TABLET BY MOUTH  DAILY   valsartan (DIOVAN) 40 MG tablet, Take 1 tablet (40 mg total) by mouth daily.  Current Outpatient Medications (Respiratory):    fluticasone (FLONASE) 50 MCG/ACT nasal spray, Place into both nostrils daily.  Current Outpatient Medications (Analgesics):    acetaminophen (TYLENOL) 500 MG tablet, Take 500 mg by mouth as needed.   Current Outpatient Medications (Other):    Blood Pressure Monitoring (ADULT  BLOOD PRESSURE CUFF LG) KIT, 1 Device by Does not apply route daily as needed.   cholecalciferol (VITAMIN D3) 25 MCG (1000 UNIT) tablet, Take 1,000 Units by mouth daily.   sertraline (ZOLOFT) 25 MG tablet, Take 1 tablet (25 mg total) by mouth daily.   Spacer/Aero-Holding Dorise Bullion, 1 Device by Does not apply route daily as needed.   traZODone (DESYREL) 50 MG tablet, Take 1-2 tablets (50-100 mg total) by mouth at bedtime as needed.   Reviewed prior external information including notes and imaging from  primary care provider As well as notes that were available from care everywhere and other healthcare systems.  Past medical history, social, surgical and family history all reviewed in electronic medical record.  [name] pertanent information unless stated regarding to the chief complaint.   Review of Systems:  [name] headache, visual changes, nausea, vomiting, diarrhea, constipation, dizziness, abdominal pain, skin rash, fevers, chills, night sweats, weight loss, swollen lymph nodes, body aches, joint swelling, chest pain, shortness of breath, mood changes. POSITIVE muscle aches  Objective  Blood pressure 122/86, pulse 76, height 5' 8.5" (1.74 m), weight 189 lb (85.7 kg), SpO2 96 %.   General: [name] apparent distress alert and oriented x3 mood and affect normal, dressed appropriately.  HEENT: Pupils equal, extraocular movements intact  Respiratory: Patient's speak in full sentences and does not appear short of breath  Cardiovascular: [name] lower extremity edema, non tender, [name] erythema  Gait antalgic Patient has only 5 degrees of internal rotation of the left hip noted.  Patient does have of only 35 degrees of external rotation.  10 degrees of internal rotation on the right hip noted.    Impression and Recommendations:     The above documentation has been reviewed and is accurate and complete Lyndal Pulley, DO

## 2021-02-07 ENCOUNTER — Ambulatory Visit: Payer: 59 | Admitting: Family Medicine

## 2021-02-28 ENCOUNTER — Ambulatory Visit: Payer: 59 | Admitting: Family Medicine

## 2021-02-28 ENCOUNTER — Ambulatory Visit (INDEPENDENT_AMBULATORY_CARE_PROVIDER_SITE_OTHER): Payer: 59 | Admitting: Orthopaedic Surgery

## 2021-02-28 ENCOUNTER — Other Ambulatory Visit: Payer: Self-pay

## 2021-02-28 VITALS — Ht 68.5 in | Wt 189.0 lb

## 2021-02-28 DIAGNOSIS — M1612 Unilateral primary osteoarthritis, left hip: Secondary | ICD-10-CM | POA: Diagnosis not present

## 2021-02-28 DIAGNOSIS — M25552 Pain in left hip: Secondary | ICD-10-CM | POA: Diagnosis not present

## 2021-02-28 NOTE — Progress Notes (Signed)
Office Visit Note   Patient: Max [name]           Date of Birth: [birth year]           MRN: [id number] Visit Date: 02/28/2021              Requested by: Judi Saa, DO [address] [place],  [place] [address] PCP: Wynn Banker, MD   Assessment & Plan: Visit Diagnoses:  1. Unilateral primary osteoarthritis, left hip   2. Left hip pain     Plan: I went over the patient's clinical exam and x-ray findings with him.  I would like to set him up for an intra-articular steroid injection of the left hip as well as obtain a left hip MRI to fully assess the cartilage based on his plain film findings and clinical exam findings.  I did give him a handout about hip replacement surgery and will talk with him about this further after we see him back in follow-up.  We will see about scheduling the intra-articular steroid injection under fluoroscopy by Dr. Alvester Morin.  Follow-Up Instructions: Return in about 4 weeks (around 03/28/2021).   Orders:  No orders of the defined types were placed in this encounter.  No orders of the defined types were placed in this encounter.     Procedures: No procedures performed   Clinical Data: No additional findings.   Subjective: Chief Complaint  Patient presents with   Left Hip - Pain  The patient is someone who is sent to me to evaluate and treat left hip arthritis.  He has been having left hip and groin pain for 2 years now.  He originally started having issues after a torn hamstring.  He has now been told he has arthritis in his left hip.  He is actually seen a primary care sports medicine physician who is treating him for this as well.  He cannot take anti-inflammatories due to chronic kidney disease.  He had 1 injection around the hip but this was not done under any type of fluoroscopic guidance or ultrasound guidance.  He does hurt with weightbearing with the left hip and with pivoting without left hip.  He denies any radicular  symptoms or back pain.  This is been slowly getting worse for over 2 years now and at this point is detriment affecting his mobility and his quality of life.  He does work as a Estate agent.  He is not a diabetic and is a thin individual.  HPI  Review of Systems He currently denies any headache, chest pain, shortness of breath, fever, chills, nausea, vomiting  Objective: Vital Signs: Ht 5' 8.5" (1.74 m)   Wt 189 lb (85.7 kg)   BMI 28.32 kg/m   Physical Exam He is alert and orient x3 and in no acute distress Ortho Exam Examination of both hips shows full range of motion with internal and external rotation but some slight stiffness on examination of the left hip and pain in the groin with motion of the left hip. Specialty Comments:  No specialty comments available.  Imaging: No results found. An AP pelvis and lateral of the left hip does show slight joint space narrowing of both hips and periarticular osteophytes showing mild to moderate arthritis.  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/10/2019   Hypertension 09/03/2019   Low testosterone 09/03/2019   Past Medical History:  Diagnosis Date   Abnormal renal function    states labs were elevated due to taking ANSAIDS-he is treated by a urologist in [place]   Arthritis    Hamstring tear    left   Hypertension    Testosterone deficiency    treated by [facility] [place]    Family History  Problem Relation Age of Onset   Hypertension Mother    Hypertension Father    Bowel Disease Father        obstruction   Diabetes Sister    Hypertension Sister    Hypertension Sister     No past surgical history on file. Social History   Occupational History   Not on file  Tobacco Use   Smoking status: Never   Smokeless tobacco: Never  Vaping Use   Vaping Use: Never used  Substance and Sexual Activity   Alcohol use: Not Currently     Comment: quit at age 57   Drug use: Never   Sexual activity: Yes

## 2021-03-06 ENCOUNTER — Other Ambulatory Visit: Payer: Self-pay | Admitting: Family Medicine

## 2021-03-08 ENCOUNTER — Telehealth: Payer: Self-pay

## 2021-03-08 NOTE — Telephone Encounter (Signed)
Pt called and needs to reschedule his upcoming apt with Dr. Alvester Morin.

## 2021-03-09 NOTE — Telephone Encounter (Signed)
Called patient and rescheduled appointment

## 2021-03-21 ENCOUNTER — Ambulatory Visit: Payer: 59 | Admitting: Physical Medicine and Rehabilitation

## 2021-03-22 ENCOUNTER — Encounter: Payer: Self-pay | Admitting: Physical Medicine and Rehabilitation

## 2021-03-22 ENCOUNTER — Ambulatory Visit: Payer: 59 | Admitting: Physical Medicine and Rehabilitation

## 2021-03-22 ENCOUNTER — Ambulatory Visit: Payer: Self-pay

## 2021-03-22 ENCOUNTER — Other Ambulatory Visit: Payer: Self-pay

## 2021-03-22 DIAGNOSIS — M25552 Pain in left hip: Secondary | ICD-10-CM | POA: Diagnosis not present

## 2021-03-22 NOTE — Progress Notes (Signed)
   Max [name] - 60 y.o. male MRN [id number]  Date of birth: [birth year]  Office Visit Note: Visit Date: 03/22/2021 PCP: Wynn Banker, MD Referred by: Wynn Banker, MD  Subjective: Chief Complaint  Patient presents with   Left Hip - Pain   HPI:  Max [name] is a 60 y.o. male who comes in today at the request of Dr. Doneen Poisson for planned Left anesthetic hip arthrogram with fluoroscopic guidance.  The patient has failed conservative care including home exercise, medications, time and activity modification.  This injection will be diagnostic and hopefully therapeutic.  Please see requesting physician notes for further details and justification.   ROS Otherwise per HPI.  Assessment & Plan: Visit Diagnoses:    ICD-10-CM   1. Pain in left hip  M25.552 Large Joint Inj: L hip joint    XR C-ARM NO REPORT      Plan: No additional findings.   Meds & Orders: No orders of the defined types were placed in this encounter.   Orders Placed This Encounter  Procedures   Large Joint Inj: L hip joint   XR C-ARM NO REPORT    Follow-up: Return for visit to requesting provider as needed.   Procedures: Large Joint Inj: L hip joint on 03/22/2021 9:15 AM Indications: diagnostic evaluation and pain Details: 22 G 3.5 in needle, fluoroscopy-guided anterior approach  Arthrogram: No  Medications: 4 mL bupivacaine 0.25 %; 60 mg triamcinolone acetonide 40 MG/ML Outcome: tolerated well, no immediate complications  There was excellent flow of contrast producing a partial arthrogram of the hip. The patient did have relief of symptoms during the anesthetic phase of the injection. Procedure, treatment alternatives, risks and benefits explained, specific risks discussed. Consent was given by the patient. Immediately prior to procedure a time out was called to verify the correct patient, procedure, equipment, support staff and site/side marked as required. Patient was prepped  and draped in the usual sterile fashion.         Clinical History: No specialty comments available.     Objective:  VS:  HT:    WT:   BMI:     BP:   HR: bpm  TEMP: ( )  RESP:  Physical Exam   Imaging: No results found.

## 2021-03-22 NOTE — Progress Notes (Signed)
Pt state left hip pain. Pt state walking makes the pain worse. Pt state he takes over the pain to help ease his pain.  Numeric Pain Rating Scale and Functional Assessment Average Pain 8   In the last MONTH (on 0-10 scale) has pain interfered with the following?  1. General activity like being  able to carry out your everyday physical activities such as walking, climbing stairs, carrying groceries, or moving a chair?  Rating(10)   -BT, -Dye Allergies.

## 2021-03-26 ENCOUNTER — Encounter: Payer: Self-pay | Admitting: Family Medicine

## 2021-03-28 ENCOUNTER — Telehealth: Payer: Self-pay | Admitting: Orthopaedic Surgery

## 2021-03-28 ENCOUNTER — Encounter: Payer: Self-pay | Admitting: Orthopaedic Surgery

## 2021-03-28 ENCOUNTER — Ambulatory Visit (INDEPENDENT_AMBULATORY_CARE_PROVIDER_SITE_OTHER): Payer: 59 | Admitting: Orthopaedic Surgery

## 2021-03-28 DIAGNOSIS — M25559 Pain in unspecified hip: Secondary | ICD-10-CM

## 2021-03-28 MED ORDER — BUPIVACAINE HCL 0.25 % IJ SOLN
4.0000 mL | INTRAMUSCULAR | Status: AC | PRN
Start: 2021-03-22 — End: 2021-03-22
  Administered 2021-03-22: 4 mL via INTRA_ARTICULAR

## 2021-03-28 MED ORDER — TRIAMCINOLONE ACETONIDE 40 MG/ML IJ SUSP
60.0000 mg | INTRAMUSCULAR | Status: AC | PRN
  Administered 2021-03-22: 60 mg via INTRA_ARTICULAR

## 2021-03-28 NOTE — Progress Notes (Signed)
Mr. Mcfann returns today for follow-up of his left hip.  Unfortunately has not had his MRI as of yet.  He did undergo the injection of the left hip by Dr. Alvester Morin intra-articular.  He states it helped some.  We are still awaiting the MRI which is to be performed this Sunday.  Therefore we will see him back next week after the MRI to go over the results and discuss further treatment.  No charge for today's office visit.

## 2021-03-28 NOTE — Telephone Encounter (Signed)
Pt came in for appt today with Magnus Ivan but has not had mri completed as he needed to change the date for it. Pt stated Bronson Curb came in to see him this morning but told him he won't be charged since they can not do anything until Mri is completed and resch'd with Bronson Curb for 04/12/21.

## 2021-04-01 ENCOUNTER — Ambulatory Visit
Admission: RE | Admit: 2021-04-01 | Discharge: 2021-04-01 | Disposition: A | Discharge status: Home / Self Care | Payer: 59 | Source: Ambulatory Visit | Attending: Orthopaedic Surgery | Admitting: Orthopaedic Surgery

## 2021-04-01 ENCOUNTER — Other Ambulatory Visit: Payer: Self-pay

## 2021-04-01 DIAGNOSIS — M1612 Unilateral primary osteoarthritis, left hip: Secondary | ICD-10-CM

## 2021-04-01 DIAGNOSIS — M25552 Pain in left hip: Secondary | ICD-10-CM

## 2021-04-11 ENCOUNTER — Encounter: Payer: Self-pay | Admitting: Family Medicine

## 2021-04-12 ENCOUNTER — Encounter: Payer: Self-pay | Admitting: Physician Assistant

## 2021-04-12 ENCOUNTER — Ambulatory Visit (INDEPENDENT_AMBULATORY_CARE_PROVIDER_SITE_OTHER): Payer: 59 | Admitting: Physician Assistant

## 2021-04-12 DIAGNOSIS — M25552 Pain in left hip: Secondary | ICD-10-CM | POA: Diagnosis not present

## 2021-04-12 NOTE — Progress Notes (Signed)
HPI: [name] returns today to go over the MRI of his left hip.  He states he only got 2 days of some relief after the intra-articular injection of the left hip which was done by Max [name].  Rates the pain relief to been 40 to 50%.  He continues to have a pinching sensation in his hip.  His pain is back to his baseline. MRI dated 04/01/2021 was reviewed with the patient.  MRI images reviewed.  Left hip with spurring about the femoral head however the articular cartilage is without any signs of chondral defects or edema.  Bilateral aspherical femoral heads noted.  Mild spurring superior acetabulum bilaterally.  No acute fractures.  No effusion.  Physical exam: General well-developed well-nourished male who ambulates without any assistive device. Bilateral hips he has full range of motion with external rotation. Pain with internal rotation of the left hip and slightly limited internal rotation.  Right hip full internal rotation without pain.  Impression: Left hip pain  Plan we will send him to Max [name] for evaluation of possible cam impingement left hip.  Questions were encouraged and answered at length.  Copy of MRI report was given.  Follow-up with Max [name] as needed.

## 2021-04-19 ENCOUNTER — Other Ambulatory Visit: Payer: Self-pay

## 2021-04-19 ENCOUNTER — Ambulatory Visit (INDEPENDENT_AMBULATORY_CARE_PROVIDER_SITE_OTHER): Payer: 59 | Admitting: Orthopaedic Surgery

## 2021-04-19 DIAGNOSIS — M1612 Unilateral primary osteoarthritis, left hip: Secondary | ICD-10-CM | POA: Diagnosis not present

## 2021-04-19 NOTE — Progress Notes (Signed)
Chief Complaint: left hip pain     History of Present Illness:   Max [name] is a 60 y.o. male presents with left hip pain as a referral for second opinion from Dr. Ninfa Linden.  He states that in 2020 he was diagnosed with a torn hamstring.  Since that time he has had left groin pain which is aching and essentially bothersome to him the majority of the time.  He did have a steroid injection by Dr. Ernestina Patches previously which she says gave him about 1 day of 40 to 50% pain relief.  He is on physical therapy in the past with minimal relief.  He is not able to take NSAIDs due to his kidney disease.  He does not have diabetes.  He does take Tylenol which is not significantly helpful.  He is here today for second opinion regarding whether or not hip preservation would be possible.  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  He is here today with his wife.    Surgical History:   None  PMH/PSH/Family History/Social History/Meds/Allergies:    Past Medical History:  Diagnosis Date   Abnormal renal function    states labs were elevated due to taking ANSAIDS-he is treated by a urologist in [place]    Hamstring tear    left   Hypertension    Testosterone deficiency    treated by [facility] [place]   No past surgical history on file. Social History   Socioeconomic History   Marital status: Single    Spouse name: Not on file   Number of children: Not on file   Years of education: Not on file   Highest education level: Not on file  Occupational History   Not on file  Tobacco Use   Smoking status: Never   Smokeless tobacco: Never  Vaping Use   Vaping Use: Never used  Substance and Sexual Activity   Alcohol use: Not Currently    Comment: quit at age 89   Drug use: Never   Sexual activity: Yes  Other Topics Concern   Not on file  Social History Narrative   Not on file   Social Determinants of Health    Financial Resource Strain: Not on file  Food Insecurity: Not on file  Transportation Needs: Not on file  Physical Activity: Not on file  Stress: Not on file  Social Connections: Not on file   Family History  Problem Relation Age of Onset   Hypertension Mother    Hypertension Father    Bowel Disease Father        obstruction   Diabetes Sister    Hypertension Sister    Hypertension Sister    Allergies  Allergen Reactions   Ibuprofen     Affects kidney function significantly   Current Outpatient Medications  Medication Sig Dispense Refill   acetaminophen (TYLENOL) 500 MG tablet Take 500 mg by mouth as needed.     amLODipine (NORVASC) 10 MG tablet Take 1 tablet (10 mg total) by mouth daily. 90 tablet 3   Blood Pressure Monitoring (ADULT BLOOD PRESSURE CUFF LG) KIT 1 Device by Does not apply route daily as needed. 1 kit 0   cholecalciferol (VITAMIN D3) 25 MCG (1000 UNIT) tablet Take 1,000 Units by mouth  daily.     fluticasone (FLONASE) 50 MCG/ACT nasal spray Place into both nostrils daily.     sertraline (ZOLOFT) 25 MG tablet Take 1 tablet (25 mg total) by mouth daily. 90 tablet 1   Spacer/Aero-Holding Chambers DEVI 1 Device by Does not apply route daily as needed. 1 Device 0   tadalafil (CIALIS) 5 MG tablet TAKE 1 TABLET BY MOUTH  DAILY 90 tablet 1   testosterone cypionate (DEPOTESTOSTERONE CYPIONATE) 200 MG/ML injection Inject 1 mL (200 mg total) into the muscle once a week. 12 mL 3   traZODone (DESYREL) 50 MG tablet Take 1-2 tablets (50-100 mg total) by mouth at bedtime as needed. 180 tablet 3   valsartan (DIOVAN) 40 MG tablet Take 1 tablet (40 mg total) by mouth daily. 90 tablet 1   No current facility-administered medications for this visit.   No results found.  Review of Systems:   A ROS was performed including pertinent positives and negatives as documented in the HPI.  Physical Exam :   Constitutional: NAD and appears stated age Neurological: Alert and  oriented Psych: Appropriate affect and cooperative There were no vitals taken for this visit.   Comprehensive Musculoskeletal Exam:    Inspection Right Left  Skin No atrophy or gross abnormalities appreciated No atrophy or gross abnormalities appreciated  Palpation    Tenderness None None  Crepitus None None  Range of Motion    Flexion (passive) 120 120  Extension 30 30  IR 30 30 with pain  ER 45 45  Strength    Flexion  5/5 5/5  Extension 5/5 5/5  Special Tests    FABIR Negative Positive  FADER Negative Negative  ER Lag/Capsular Insufficiency Negative Negative  Instability Negative Negative  Sacroiliac pain Negative  Negative   Instability    Generalized Laxity No No  Neurologic    sciatic, femoral, obturator nerves intact to light sensation  Vascular/Lymphatic    DP pulse 2+ 2+  Lumbar Exam    Patient has symmetric lumbar range of motion with negative pain referral to hip  Weakness with resisted abduction of the left hip   Imaging:   Xray (3 views AP pelvis 2 views left hip): There is blurring of the source heel consistent with osteoarthritis.  There is an asymmetric femoral head consistent with osteoarthritis and a noticeable cam deformity  MRI (left hip): There is focal cartilage loss involving the sore seal of the left hip which measures up to 30% of the weightbearing surface as well as labral tear.  I personally reviewed and interpreted the radiographs.   Assessment:   60 year old very pleasant male with left hip pain now going on for several years.  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  I discussed with him that unfortunately due to the longevity of this cam lesion, I believe that he does have enough significant cartilage wear that arthroscopic surgery would not get him significant  relief.  I believe that when he regains weightbearing status he may have a recurrence of his symptoms due to his cartilage loss.  We had an extensive conversation about nonsurgical preservation techniques like an additional ultrasound-guided steroid injection versus a ultrasound-guided PRP injection.  He understands these options but is not willing to pursue an option that would only result in more temporary relief.  I  talked extensively with him about total hip arthroplasty and do believe that he would benefit significantly from this.  We discussed that given the fact that his cartilage loss is more focal but does involve the weightbearing dome, he would likely get very significant relief from arthroplasty.  We also discussed that this would be more of a long-term solution for the hip.  I will plan to contact Dr. Blackman and discussed these findings.  I will plan for him to follow back up with Dr. Blackman to discuss hip arthroplasty as he would like a more definitive solution.  Plan :    -Plan for additional hip arthroplasty discussion with Dr. Blackman     I personally saw and evaluated the patient, and participated in the management and treatment plan.  Anuel Bokshan, MD Attending Physician, Orthopedic Surgery  This document was dictated using Dragon voice recognition software. A reasonable attempt at proof reading has been made to minimize errors.  

## 2021-05-09 ENCOUNTER — Ambulatory Visit (INDEPENDENT_AMBULATORY_CARE_PROVIDER_SITE_OTHER): Payer: 59 | Admitting: Orthopaedic Surgery

## 2021-05-09 ENCOUNTER — Encounter: Payer: Self-pay | Admitting: Orthopaedic Surgery

## 2021-05-09 VITALS — Ht 68.5 in | Wt 188.6 lb

## 2021-05-09 DIAGNOSIS — M25552 Pain in left hip: Secondary | ICD-10-CM

## 2021-05-09 NOTE — Progress Notes (Signed)
The patient did end up seeing Dr.Bokshan my partner for the possibility of a hip arthroscopy given his left hip femoral acetabular impingement.  I did speak to my partner who felt that the cartilage area that is bothering the patient would not benefit from an arthroscopic intervention.  The patient does have hip pain but not on daily basis and its certainly more with certain activities.  I did go over the plain films and the MRI with the patient and showed him the images.  I do not see any cartilage loss in the femoral head and it is hard to recommend a hip replacement based on his clinical exam and x-ray and MRI findings.  I did read Dr. Serena Croissant note and I think one viable option would be an ultrasound-guided PRP injection.  I think that is the next reasonable step to try.  Right now would not recommend a hip replacement based on my exam and what I am seeing on his MRI.  The patient agrees with this treatment plan as well.

## 2021-05-23 ENCOUNTER — Ambulatory Visit (HOSPITAL_BASED_OUTPATIENT_CLINIC_OR_DEPARTMENT_OTHER): Payer: Self-pay | Admitting: Orthopaedic Surgery

## 2021-05-23 ENCOUNTER — Ambulatory Visit (HOSPITAL_BASED_OUTPATIENT_CLINIC_OR_DEPARTMENT_OTHER): Payer: 59 | Admitting: Orthopaedic Surgery

## 2021-05-23 ENCOUNTER — Other Ambulatory Visit: Payer: Self-pay

## 2021-05-23 ENCOUNTER — Other Ambulatory Visit (HOSPITAL_BASED_OUTPATIENT_CLINIC_OR_DEPARTMENT_OTHER): Payer: Self-pay

## 2021-05-23 DIAGNOSIS — M25852 Other specified joint disorders, left hip: Secondary | ICD-10-CM | POA: Diagnosis not present

## 2021-05-23 MED ORDER — ASPIRIN EC 325 MG PO TBEC
325.0000 mg | DELAYED_RELEASE_TABLET | Freq: Every day | ORAL | 0 refills | Status: DC
  Filled 2021-05-23: qty 30, 30d supply, fill #0

## 2021-05-23 MED ORDER — ACETAMINOPHEN 500 MG PO TABS
500.0000 mg | ORAL_TABLET | Freq: Three times a day (TID) | ORAL | 0 refills | Status: AC
  Filled 2021-05-23: qty 30, 10d supply, fill #0

## 2021-05-23 MED ORDER — OXYCODONE HCL 5 MG PO TABS
5.0000 mg | ORAL_TABLET | ORAL | 0 refills | Status: DC | PRN
Start: 2021-05-23 — End: 2021-09-05
  Filled 2021-05-23: qty 20, 4d supply, fill #0

## 2021-05-23 NOTE — Patient Instructions (Signed)
Disability and Out-of-Work Paperwork ° °If you would like to file disability (short term and/or and long term), FMLA, or other out-of-work paperwork, please mail, hand deliver, or fax the paperwork to our main office:  ° °[facility]  °[address]  °[place], [place] [address] °Phone: [phone] °Fax: [phone] ° °We are unable to complete these forms in our [facility] satellite office and want to ensure your paperwork is filed in an appropriate and proper manner.  ° °Thank you for understanding.  ° °Pre-Operative Appointment ° °Post (after) Operative Medications:  ° °You were likely prescribed 4 medications:  °A. Acetaminophen (Tylenol) 500 MG °B. Ibuprofen (Advil) 800 MG °C. Oxycodone 5 MG °D. Aspirin 325 MG ° °These medications do not need to be taken until AFTER  surgery. All medications should be taken according to prescription, pharmacy and prescriber direction.  ° °These medications can be picked up from the [facility] on the first floor of [facility] immediately after your appointment.  ° °Aspirin should be taken once a day for 30 days following surgery to prevent complications, such as blood clots.  ° °Oxycodone is an opioid and should be used for breakthrough pain.  ° °Acetaminophen and Ibuprofen should be used intermittently for pain control.  ° ° °2. Surgery Scheduling ° °Our surgery scheduler, April Beavers, will be in contact with you to schedule your surgery. She will give you details on the day, time and the location your surgery will be performed. April Beavers will also schedule your first post-operative appointment 2 weeks after your scheduled surgery date. This appointment will be in our office at [facility], not the location where surgery is performed.  ° °Closer to the date of your surgery, a nurse from the hospital or surgery center will contact you about specific details prior to  surgery such as when to cease eating, when to arrive at the facility, and what you can expect the day of surgery.  ° °April Beavers  °[phone] ° ° ° °3. Durable Medical Equipment: ° °If you were given a medical device at your appointment such as, shoulder sling, post operative knee brace, or walking boot, you will bring that to the hospital/surgery center with you when you check in the day of surgery.  ° °Be sure to inform the pre-operative staff that the equipment should go with you to the operating room.  ° °You will wake up after surgery with the equipment on and will continue to wear it as explained by Dr. Bokshan.  ° ° °Physical Therapy After Surgery ° °We have sent a referral to the physical therapy office in the [facility], located on the first floor within the [facility].  ° °If you have an active MyChart account, look for an invitation via MyChart to schedule your Physical Therapy evaluation. If you do not have an active mychart, [facility] will reach out within 3 business days. Feel free to contact them directly to schedule your evaluation at [phone].  ° °If you are having surgery, you may still use mychart to schedule your evaluation but please also call to preschedule your follow up appointments.  ° °Please schedule your evaluation within 3-5 days of your surgery if you choose to use MyChart or contact the physical therapy office directly.  ° °If you have any questions about physical therapy you may contact Jessica Hightower, PT, DPT at [phone].   °

## 2021-05-23 NOTE — Progress Notes (Signed)
° °                            ° ° °Chief Complaint: left hip pain °  ° ° °History of Present Illness:  ° °05/23/2021: Max [name] presents today with ongoing left hip pain.  He recently saw Dr. Blackman who does not believe that he is a candidate for hip arthroplasty.  He continues to have mechanical type symptoms specifically with pivoting type exercises when he is at work that causes pain.  He does not really have pain while at rest which is sitting down.  Here today for further discussion ° °Max [name] is a 60 y.o. male presents with left hip pain as a referral for second opinion from Dr. Blackman.  He states that in 2020 he was diagnosed with a torn hamstring.  Since that time he has had left groin pain which is aching and essentially bothersome to him the majority of the time.  He did have a steroid injection by Dr. Newton previously which she says gave him about 1 day of 40 to 50% pain relief.  He is on physical therapy in the past with minimal relief.  He is not able to take NSAIDs due to his kidney disease.  He does not have diabetes.  He does take Tylenol which is not significantly helpful.  He is here today for second opinion regarding whether or not hip preservation would be possible.  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  He is here today with his wife. ° ° ° °Surgical History:   °None ° °PMH/PSH/Family History/Social History/Meds/Allergies:   ° °Past Medical History:  °Diagnosis Date  ° Abnormal renal function   ° states labs were elevated due to taking ANSAIDS-he is treated by a urologist in [place]  ° Arthritis   ° Hamstring tear   ° left  ° Hypertension   ° Testosterone deficiency   ° treated by [facility]  ° °No past surgical history on file. °Social History  ° °Socioeconomic History  ° Marital status: Single  °  Spouse name: Not on file  ° Number of children: Not on file  ° Years of education: Not on file  ° Highest education level: Not on file   °Occupational History  ° Not on file  °Tobacco Use  ° Smoking status: Never  ° Smokeless tobacco: Never  °Vaping Use  ° Vaping Use: Never used  °Substance and Sexual Activity  ° Alcohol use: Not Currently  °  Comment: quit at age 41  ° Drug use: Never  ° Sexual activity: Yes  °Other Topics Concern  ° Not on file  °Social History Narrative  ° Not on file  ° °Social Determinants of Health  ° °Financial Resource Strain: Not on file  °Food Insecurity: Not on file  °Transportation Needs: Not on file  °Physical Activity: Not on file  °Stress: Not on file  °Social Connections: Not on file  ° °Family History  °Problem Relation Age of Onset  ° Hypertension Mother   ° Hypertension Father   ° Bowel Disease Father   °     obstruction  ° Diabetes Sister   ° Hypertension Sister   ° Hypertension Sister   ° °Allergies  °Allergen Reactions  ° Ibuprofen   °  Affects kidney function significantly  ° °Current Outpatient Medications  °Medication Sig Dispense Refill  ° acetaminophen (TYLENOL) 500   MG tablet Take 500 mg by mouth as needed.    ° amLODipine (NORVASC) 10 MG tablet Take 1 tablet (10 mg total) by mouth daily. 90 tablet 3  ° Blood Pressure Monitoring (ADULT BLOOD PRESSURE CUFF LG) KIT 1 Device by Does not apply route daily as needed. 1 kit 0  ° cholecalciferol (VITAMIN D3) 25 MCG (1000 UNIT) tablet Take 1,000 Units by mouth daily.    ° fluticasone (FLONASE) 50 MCG/ACT nasal spray Place into both nostrils daily.    ° sertraline (ZOLOFT) 25 MG tablet Take 1 tablet (25 mg total) by mouth daily. 90 tablet 1  ° Spacer/Aero-Holding Chambers DEVI 1 Device by Does not apply route daily as needed. 1 Device 0  ° tadalafil (CIALIS) 5 MG tablet TAKE 1 TABLET BY MOUTH  DAILY 90 tablet 1  ° testosterone cypionate (DEPOTESTOSTERONE CYPIONATE) 200 MG/ML injection Inject 1 mL (200 mg total) into the muscle once a week. 12 mL 3  ° traZODone (DESYREL) 50 MG tablet Take 1-2 tablets (50-100 mg total) by mouth at bedtime as needed. 180 tablet 3  °  valsartan (DIOVAN) 40 MG tablet Take 1 tablet (40 mg total) by mouth daily. 90 tablet 1  ° °No current facility-administered medications for this visit.  ° °No results found. ° °Review of Systems:   °A ROS was performed including pertinent positives and negatives as documented in the HPI. ° °Physical Exam :   °Constitutional: NAD and appears stated age °Neurological: Alert and oriented °Psych: Appropriate affect and cooperative °There were no vitals taken for this visit.  ° °Comprehensive Musculoskeletal Exam:   ° °Inspection Right Left  °Skin No atrophy or gross abnormalities appreciated No atrophy or gross abnormalities appreciated  °Palpation    °Tenderness None None  °Crepitus None None  °Range of Motion    °Flexion (passive) 120 120  °Extension 30 30  °IR 30 30 with pain  °ER 45 45  °Strength    °Flexion  5/5 5/5  °Extension 5/5 5/5  °Special Tests    °FABIR Negative Positive  °FADER Negative Negative  °ER Lag/Capsular Insufficiency Negative Negative  °Instability Negative Negative  °Sacroiliac pain Negative  Negative   °Instability    °Generalized Laxity No No  °Neurologic    °sciatic, femoral, obturator nerves intact to light sensation  °Vascular/Lymphatic    °DP pulse 2+ 2+  °Lumbar Exam    °Patient has symmetric lumbar range of motion with negative pain referral to hip  °Weakness with resisted abduction of the left hip ° ° °Imaging:   °Xray (3 views AP pelvis 2 views left hip): °There is blurring of the source heel consistent with osteoarthritis.  There is an asymmetric femoral head consistent with osteoarthritis and a noticeable cam deformity ° °MRI (left hip): °There is focal cartilage loss involving the sore seal of the left hip which measures up to 30% of the weightbearing surface as well as labral tear. ° °I personally reviewed and interpreted the radiographs. ° ° °Assessment:   °60-year-old very pleasant male with left hip pain now going on for several years.  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  We did discuss the possible role for arthroscopy.  I do believe that this could give him relief although I cannot guarantee that this will provide   a permanent solution.  We did discuss the likelihood of ultimately going on to hip arthroplasty.  We did discuss and review the literature in detail regarding hip arthroscopy in patients older than 40-50.  At this point he is quite limited by his hip issues and is hopeful to pursue any type of arthroscopic intervention that could give him relief and allow him to optimize his work status.  As result I do believe that we could proceed with hip arthroscopy at this time ° °Plan :   ° °-Plan left hip arthroscopy with cam and pincer debridement as well as labral repair ° ° °After a lengthy discussion of treatment options, including risks, benefits, alternatives, complications of surgical and nonsurgical conservative options, the patient elected surgical repair.  ° °The patient  is aware of the material risks  and complications including, but not limited to injury to adjacent structures, neurovascular injury, infection, numbness, bleeding, implant failure, thermal burns, stiffness, persistent pain, failure to heal, disease transmission from allograft, need for further surgery, dislocation, anesthetic risks, blood clots, risks of death,and others. The probabilities of surgical success and failure discussed with patient given their particular co-morbidities.The time and nature of expected rehabilitation and recovery was discussed.The patient's questions were all answered preoperatively.  No barriers to understanding were noted. °I explained the natural history of the disease process and Rx rationale.  I explained to the patient what I considered to be reasonable expectations given their personal situation.   The final treatment plan was arrived at through a shared patient decision making process model. ° ° ° °Patient was prescribed a hinged knee brace for the diagnosis listed above under assessment. The patient is ambulatory but has weakness and / or instability of their hip which requires stabilization from this semi-rigid / rigid orthosis to improve their function. ° ° ° ° ° ° °I personally saw and evaluated the patient, and participated in the management and treatment plan. ° °Lenix Mischa Pollard, MD °Attending Physician, Orthopedic Surgery ° °This document was dictated using Dragon voice recognition software. A reasonable attempt at proof reading has been made to minimize errors. ° °

## 2021-05-23 NOTE — H&P (View-Only) (Signed)
Chief Complaint: left hip pain     History of Present Illness:   05/23/2021: Max [name] presents today with ongoing left hip pain.  He recently saw Dr. Ninfa Linden who does not believe that he is a candidate for hip arthroplasty.  He continues to have mechanical type symptoms specifically with pivoting type exercises when he is at work that causes pain.  He does not really have pain while at rest which is sitting down.  Here today for further discussion  Max [name] is a 61 y.o. male presents with left hip pain as a referral for second opinion from Dr. Ninfa Linden.  He states that in 2020 he was diagnosed with a torn hamstring.  Since that time he has had left groin pain which is aching and essentially bothersome to him the majority of the time.  He did have a steroid injection by Dr. Ernestina Patches previously which she says gave him about 1 day of 40 to 50% pain relief.  He is on physical therapy in the past with minimal relief.  He is not able to take NSAIDs due to his kidney disease.  He does not have diabetes.  He does take Tylenol which is not significantly helpful.  He is here today for second opinion regarding whether or not hip preservation would be possible.  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  He is here today with his wife.    Surgical History:   None  PMH/PSH/Family History/Social History/Meds/Allergies:    Past Medical History:  Diagnosis Date   Abnormal renal function    states labs were elevated due to taking ANSAIDS-he is treated by a urologist in [place]    Hamstring tear    left   Hypertension    Testosterone deficiency    treated by [facility]   No past surgical history on file. Social History   Socioeconomic History   Marital status: Single    Spouse name: Not on file   Number of children: Not on file   Years of education: Not on file   Highest education level: Not on file   Occupational History   Not on file  Tobacco Use   Smoking status: Never   Smokeless tobacco: Never  Vaping Use   Vaping Use: Never used  Substance and Sexual Activity   Alcohol use: Not Currently    Comment: quit at age 24   Drug use: Never   Sexual activity: Yes  Other Topics Concern   Not on file  Social History Narrative   Not on file   Social Determinants of Health   Financial Resource Strain: Not on file  Food Insecurity: Not on file  Transportation Needs: Not on file  Physical Activity: Not on file  Stress: Not on file  Social Connections: Not on file   Family History  Problem Relation Age of Onset   Hypertension Mother    Hypertension Father    Bowel Disease Father        obstruction   Diabetes Sister    Hypertension Sister    Hypertension Sister    Allergies  Allergen Reactions   Ibuprofen     Affects kidney function significantly   Current Outpatient Medications  Medication Sig Dispense Refill   acetaminophen (TYLENOL) 500  MG tablet Take 500 mg by mouth as needed.     amLODipine (NORVASC) 10 MG tablet Take 1 tablet (10 mg total) by mouth daily. 90 tablet 3   Blood Pressure Monitoring (ADULT BLOOD PRESSURE CUFF LG) KIT 1 Device by Does not apply route daily as needed. 1 kit 0   cholecalciferol (VITAMIN D3) 25 MCG (1000 UNIT) tablet Take 1,000 Units by mouth daily.     fluticasone (FLONASE) 50 MCG/ACT nasal spray Place into both nostrils daily.     sertraline (ZOLOFT) 25 MG tablet Take 1 tablet (25 mg total) by mouth daily. 90 tablet 1   Spacer/Aero-Holding Chambers DEVI 1 Device by Does not apply route daily as needed. 1 Device 0   tadalafil (CIALIS) 5 MG tablet TAKE 1 TABLET BY MOUTH  DAILY 90 tablet 1   testosterone cypionate (DEPOTESTOSTERONE CYPIONATE) 200 MG/ML injection Inject 1 mL (200 mg total) into the muscle once a week. 12 mL 3   traZODone (DESYREL) 50 MG tablet Take 1-2 tablets (50-100 mg total) by mouth at bedtime as needed. 180 tablet 3    valsartan (DIOVAN) 40 MG tablet Take 1 tablet (40 mg total) by mouth daily. 90 tablet 1   No current facility-administered medications for this visit.   No results found.  Review of Systems:   A ROS was performed including pertinent positives and negatives as documented in the HPI.  Physical Exam :   Constitutional: NAD and appears stated age Neurological: Alert and oriented Psych: Appropriate affect and cooperative There were no vitals taken for this visit.   Comprehensive Musculoskeletal Exam:    Inspection Right Left  Skin No atrophy or gross abnormalities appreciated No atrophy or gross abnormalities appreciated  Palpation    Tenderness None None  Crepitus None None  Range of Motion    Flexion (passive) 120 120  Extension 30 30  IR 30 30 with pain  ER 45 45  Strength    Flexion  5/5 5/5  Extension 5/5 5/5  Special Tests    FABIR Negative Positive  FADER Negative Negative  ER Lag/Capsular Insufficiency Negative Negative  Instability Negative Negative  Sacroiliac pain Negative  Negative   Instability    Generalized Laxity No No  Neurologic    sciatic, femoral, obturator nerves intact to light sensation  Vascular/Lymphatic    DP pulse 2+ 2+  Lumbar Exam    Patient has symmetric lumbar range of motion with negative pain referral to hip  Weakness with resisted abduction of the left hip   Imaging:   Xray (3 views AP pelvis 2 views left hip): There is blurring of the source heel consistent with osteoarthritis.  There is an asymmetric femoral head consistent with osteoarthritis and a noticeable cam deformity  MRI (left hip): There is focal cartilage loss involving the sore seal of the left hip which measures up to 30% of the weightbearing surface as well as labral tear.  I personally reviewed and interpreted the radiographs.   Assessment:   61 year old very pleasant male with left hip pain now going on for several years.  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  We did discuss the possible role for arthroscopy.  I do believe that this could give him relief although I cannot guarantee that this will provide  a permanent solution.  We did discuss the likelihood of ultimately going on to hip arthroplasty.  We did discuss and review the literature in detail regarding hip arthroscopy in patients older than 40-50.  At this point he is quite limited by his hip issues and is hopeful to pursue any type of arthroscopic intervention that could give him relief and allow him to optimize his work status.  As result I do believe that we could proceed with hip arthroscopy at this time  Plan :    -Plan left hip arthroscopy with cam and pincer debridement as well as labral repair   After a lengthy discussion of treatment options, including risks, benefits, alternatives, complications of surgical and nonsurgical conservative options, the patient elected surgical repair.   The patient  is aware of the material risks  and complications including, but not limited to injury to adjacent structures, neurovascular injury, infection, numbness, bleeding, implant failure, thermal burns, stiffness, persistent pain, failure to heal, disease transmission from allograft, need for further surgery, dislocation, anesthetic risks, blood clots, risks of death,and others. The probabilities of surgical success and failure discussed with patient given their particular co-morbidities.The time and nature of expected rehabilitation and recovery was discussed.The patient's questions were all answered preoperatively.  No barriers to understanding were noted. I explained the natural history of the disease process and Rx rationale.  I explained to the patient what I considered to be reasonable expectations given their personal situation.   The final treatment plan was arrived at through a shared patient decision making process model.    Patient was prescribed a hinged knee brace for the diagnosis listed above under assessment. The patient is ambulatory but has weakness and / or instability of their hip which requires stabilization from this semi-rigid / rigid orthosis to improve their function.       I personally saw and evaluated the patient, and participated in the management and treatment plan.  Vanetta Mulders, MD Attending Physician, Orthopedic Surgery  This document was dictated using Dragon voice recognition software. A reasonable attempt at proof reading has been made to minimize errors.

## 2021-05-25 ENCOUNTER — Ambulatory Visit (HOSPITAL_BASED_OUTPATIENT_CLINIC_OR_DEPARTMENT_OTHER): Payer: 59 | Admitting: Orthopaedic Surgery

## 2021-05-25 ENCOUNTER — Telehealth: Payer: Self-pay | Admitting: Orthopaedic Surgery

## 2021-05-25 ENCOUNTER — Telehealth: Payer: Self-pay | Admitting: Family Medicine

## 2021-05-25 ENCOUNTER — Encounter: Payer: Self-pay | Admitting: Family Medicine

## 2021-05-25 NOTE — Telephone Encounter (Signed)
Spoke to patient directly and stated the surgery date is undergoing the process of insurance authorization and general clearance, but our surgery scheduler will be in contact.  I informed him Gardiner Coins is doing the same process of insurance authorization and will be in contact with him when surgery is scheduled to fit him for the brace.

## 2021-05-25 NOTE — Telephone Encounter (Signed)
I spoke with patient, and advised him a clearance letter was sent to Dr. Donaciano Eva on 05/23/21. We have to have clearance before surgery can be scheduled. Patient was informed he could call Dr. Deno Etienne office if he wishes, to check on progress of clearance. Patient is requesting surgery for 06/14/21. A note of request was made.

## 2021-05-25 NOTE — Telephone Encounter (Signed)
Patient called to check on letter that had been faxed over today for his surgical clearance. Patient is scheduled for an office visit on the 25th. I let him know that the paperwork will be filed and then given to Dr.Koberlein and he was free to call back and follow up on it.

## 2021-05-25 NOTE — Telephone Encounter (Signed)
Pt called stating he had an appt on 05/23/21 and discussed being fitted for a brace. Pt also states he discussed have a hip procedure done but he hasn't heard anything for either of the matters. Pt would like a CB to be advised what his next steps will be.   [phone]

## 2021-05-31 ENCOUNTER — Telehealth: Payer: Self-pay | Admitting: Orthopaedic Surgery

## 2021-05-31 NOTE — Telephone Encounter (Signed)
Patient called. He would like to speak with [place]. His call back number is [phone]

## 2021-05-31 NOTE — Telephone Encounter (Signed)
Pt asked about brace for surgery. I informed him I had already contacted the [employer] rep and informed them his surgery was finalized and that they should be reaching out to him within a couple days

## 2021-06-06 ENCOUNTER — Ambulatory Visit: Payer: 59 | Admitting: Family Medicine

## 2021-06-07 ENCOUNTER — Telehealth: Payer: Self-pay | Admitting: Orthopaedic Surgery

## 2021-06-07 NOTE — Telephone Encounter (Signed)
Received medical records release form, $50.00 cash, FMLA and Disability paperwork from patient   Forwarding to [employer] today    Note: Patient will pick up the paperwork when completed     *Please contact patient at [phone]*

## 2021-06-12 NOTE — Progress Notes (Signed)
Surgical Instructions   Your procedure is scheduled on Tuesday, June 19, 2021.  Report to [facility] Main Entrance "A" at 05:30 A.M., then check in with the Admitting office.  Call [phone] if you have problems or questions between now and the morning of surgery:   Remember: Do not eat after midnight the night before your surgery  You may drink clear liquids until 04:30 the morning of your surgery.   Clear liquids allowed are: Water, Non-Citrus Juices (without pulp), Carbonated Beverages, Clear Tea, Black Coffee Only (NO MILK, CREAM, or POWDERED CREAMER of any kind), and Gatorade   Enhanced Recovery after Surgery for Orthopedics Enhanced Recovery after Surgery is a protocol used to improve the stress on your body and your recovery after surgery.  Patient Instructions  The day of surgery (if you do NOT have diabetes):  Drink ONE (1) Pre-Surgery Clear Ensure by 04:30 am the morning of surgery   This drink was given to you during your hospital  pre-op appointment visit. Nothing else to drink after completing the  Pre-Surgery Clear Ensure.         If you have questions, please contact your surgeons office.    Take these medicines the morning of surgery with A SIP OF WATER:  Amlodipine (Norvasc) Sertraline (Zoloft)   If needed you may take these medications the morning of surgery: Acetaminophen (Tylenol) Carboxymethylcellulose (Refresh Plus) Oxycodone (Oxy IR/Roxicodone)   As of today, STOP taking any Aspirin (unless otherwise instructed by your surgeon) or Aspirin-containing products; NSAIDS - Aleve, Naproxen, Ibuprofen, Motrin, Advil, Goody's, BC's, all herbal medications, fish oil, and all vitamins.  Follow your surgeon's instructions on when to stop Aspirin.  If no instructions were given by your surgeon then you will need to call the office to get those instructions.     After your pre-procedure COVID test  You are not required to quarantine however you are  required to wear a well-fitting mask when you are out and around people not in your household.  If your mask becomes wet or soiled, replace with a new one.  Wash your hands often with soap and water for 20 seconds or clean your hands with an alcohol-based hand sanitizer that contains at least 60% alcohol.  Do not share personal items.  Notify your provider: if you are in close contact with someone who has COVID  or if you develop a fever of 100.4 or greater, sneezing, cough, sore throat, shortness of breath or body aches.          Do not wear jewelry or makeup  Do not wear lotions, powders, colognes, or deodorant.  Do not shave 48 hours prior to surgery.  Men may shave face and neck.  Do not bring valuables to the hospital - [facility] is not responsible for any belongings or valuables.  Do NOT Smoke (Tobacco/Vaping) or drink Alcohol 24 hours prior to your procedure  If you use a CPAP at night, please bring your mask for your overnight stay.   Contacts, glasses, hearing aids, dentures or partials may not be worn into surgery, please bring cases for these belongings   For patients admitted to the hospital, discharge time will be determined by your treatment team.   Patients discharged the day of surgery will not be allowed to drive home, and someone needs to stay with them for 24 hours.  NO VISITORS WILL BE ALLOWED IN PRE-OP WHERE PATIENTS ARE PREPPED FOR SURGERY.  ONLY 1 SUPPORT PERSON MAY BE  PRESENT IN THE WAITING ROOM WHILE YOU ARE IN SURGERY.  IF YOU ARE TO BE ADMITTED, ONCE YOU ARE IN YOUR ROOM YOU WILL BE ALLOWED TWO (2) VISITORS. 1 (ONE) VISITOR MAY STAY OVERNIGHT BUT MUST ARRIVE TO THE ROOM BY 8pm.  Minor children may have two parents present. Special consideration for safety and communication needs will be reviewed on a case by case basis.  Special instructions:    Oral Hygiene is also important to reduce your risk of infection.  Remember - BRUSH YOUR TEETH THE MORNING OF  SURGERY WITH YOUR REGULAR TOOTHPASTE   [place]- Preparing For Surgery  Before surgery, you can play an important role. Because skin is not sterile, your skin needs to be as free of germs as possible. You can reduce the number of germs on your skin by washing with CHG (chlorahexidine gluconate) Soap before surgery.  CHG is an antiseptic cleaner which kills germs and bonds with the skin to continue killing germs even after washing.     Please do not use if you have an allergy to CHG or antibacterial soaps. If your skin becomes reddened/irritated stop using the CHG.  Do not shave (including legs and underarms) for at least 48 hours prior to first CHG shower. It is OK to shave your face.  Please follow these instructions carefully.     Shower the NIGHT BEFORE SURGERY and the MORNING OF SURGERY with CHG Soap.   If you chose to wash your hair, wash your hair first as usual with your normal shampoo. After you shampoo, rinse your hair and body thoroughly to remove the shampoo.    Then [employer] and genitals (private parts) with your normal soap and rinse thoroughly to remove soap.  Next use the CHG Soap as you would any other liquid soap. You can apply CHG directly to the skin and wash gently with a clean washcloth.   Apply the CHG Soap to your body ONLY FROM THE NECK DOWN.  Do not use on open wounds or open sores. Avoid contact with your eyes, ears, mouth and genitals (private parts). Wash Face and genitals (private parts)  with your normal soap.   Wash thoroughly, paying special attention to the area where your surgery will be performed.  Thoroughly rinse your body with warm water from the neck down.  DO NOT shower/wash with your normal soap after using and rinsing off the CHG Soap.  Pat yourself dry with a CLEAN TOWEL.  Wear CLEAN PAJAMAS to bed the night before surgery  Place CLEAN SHEETS on your bed the night before your surgery  DO NOT SLEEP WITH PETS.   Day of  Surgery:  Take a shower with CHG soap. Wear Clean/Comfortable clothing the morning of surgery Do not apply any deodorants/lotions.   Remember to brush your teeth WITH YOUR REGULAR TOOTHPASTE.   Please read over the fact sheets that you were given.

## 2021-06-13 ENCOUNTER — Other Ambulatory Visit (HOSPITAL_COMMUNITY): Payer: 59

## 2021-06-13 ENCOUNTER — Other Ambulatory Visit: Payer: Self-pay

## 2021-06-13 ENCOUNTER — Encounter (HOSPITAL_COMMUNITY)
Admission: RE | Admit: 2021-06-13 | Discharge: 2021-06-13 | Disposition: A | Discharge status: Home / Self Care | Payer: 59 | Source: Ambulatory Visit | Attending: Orthopaedic Surgery | Admitting: Orthopaedic Surgery

## 2021-06-13 ENCOUNTER — Encounter (HOSPITAL_COMMUNITY): Payer: Self-pay

## 2021-06-13 DIAGNOSIS — Z01818 Encounter for other preprocedural examination: Secondary | ICD-10-CM | POA: Insufficient documentation

## 2021-06-13 DIAGNOSIS — I2510 Atherosclerotic heart disease of native coronary artery without angina pectoris: Secondary | ICD-10-CM | POA: Diagnosis not present

## 2021-06-13 HISTORY — DX: Chronic kidney disease, unspecified: N18.9

## 2021-06-13 HISTORY — DX: Prediabetes: R73.03

## 2021-06-13 LAB — CBC
HCT: 52.8 % — ABNORMAL HIGH (ref 39.0–52.0)
Hemoglobin: 17.4 g/dL — ABNORMAL HIGH (ref 13.0–17.0)
MCH: 29.2 pg (ref 26.0–34.0)
MCHC: 33 g/dL (ref 30.0–36.0)
MCV: 88.6 fL (ref 80.0–100.0)
Platelets: 293 10*3/uL (ref 150–400)
RBC: 5.96 MIL/uL — ABNORMAL HIGH (ref 4.22–5.81)
RDW: 14.3 % (ref 11.5–15.5)
WBC: 5.9 10*3/uL (ref 4.0–10.5)
nRBC: 0 % (ref 0.0–0.2)

## 2021-06-13 LAB — BASIC METABOLIC PANEL
Anion gap: 10 (ref 5–15)
BUN: 9 mg/dL (ref 6–20)
CO2: 27 mmol/L (ref 22–32)
Calcium: 9.4 mg/dL (ref 8.9–10.3)
Chloride: 103 mmol/L (ref 98–111)
Creatinine, Ser: 1.32 mg/dL — ABNORMAL HIGH (ref 0.61–1.24)
GFR, Estimated: >60 mL/min (ref >60)
Glucose, Bld: 106 mg/dL — ABNORMAL HIGH (ref 70–99)
Potassium: 3.7 mmol/L (ref 3.5–5.1)
Sodium: 140 mmol/L (ref 135–145)

## 2021-06-13 LAB — GLUCOSE, CAPILLARY: Glucose-Capillary: 102 mg/dL — ABNORMAL HIGH (ref 70–99)

## 2021-06-13 NOTE — Progress Notes (Signed)
PCP - Dr. Theodis Shove Cardiologist - denies  PPM/ICD - n/a  Chest x-ray - n/a EKG - 06/13/21 Stress Test - denies ECHO - denies Cardiac Cath - denies  Sleep Study - denies CPAP - denies  Pre-diabetic CBG at PAT 102  Blood Thinner Instructions: n/a Aspirin Instructions: Pt does not take ASA consistently. Will hold as of today.   ERAS Protcol -Clear liquids until 0430 DOS. PRE-SURGERY Ensure or G2- (1) Ensure provided.  COVID TEST- Not indicated, ambulatory surgery.  Anesthesia review: No  Patient denies shortness of breath, fever, cough and chest pain at PAT appointment   All instructions explained to the patient, with a verbal understanding of the material. Patient agrees to go over the instructions while at home for a better understanding. Patient also instructed to self quarantine after being tested for COVID-19. The opportunity to ask questions was provided.

## 2021-06-18 NOTE — Anesthesia Preprocedure Evaluation (Addendum)
Anesthesia Evaluation  Patient identified by MRN, date of birth, ID band Patient awake    Reviewed: Allergy & Precautions, NPO status , Patient's Chart, lab work & pertinent test results  Airway Mallampati: II  TM Distance: >3 FB Neck ROM: Full    Dental  (+) Teeth Intact, Dental Advisory Given, Caps,    Pulmonary asthma ,    Pulmonary exam normal breath sounds clear to auscultation       Cardiovascular hypertension, Pt. on medications Normal cardiovascular exam Rhythm:Regular Rate:Normal     Neuro/Psych negative neurological ROS  negative psych ROS   GI/Hepatic negative GI ROS, Neg liver ROS,   Endo/Other  negative endocrine ROS  Renal/GU Renal InsufficiencyRenal disease     Musculoskeletal  (+) Arthritis , Left hip impingement   Abdominal   Peds  Hematology negative hematology ROS (+)   Anesthesia Other Findings   Reproductive/Obstetrics                           Anesthesia Physical Anesthesia Plan  ASA: 2  Anesthesia Plan: General   Post-op Pain Management: Tylenol PO (pre-op)   Induction: Intravenous  PONV Risk Score and Plan: 3 and Midazolam, Dexamethasone and Ondansetron  Airway Management Planned: Oral ETT  Additional Equipment:   Intra-op Plan:   Post-operative Plan: Extubation in OR  Informed Consent: I have reviewed the patients History and Physical, chart, labs and discussed the procedure including the risks, benefits and alternatives for the proposed anesthesia with the patient or authorized representative who has indicated his/her understanding and acceptance.     Dental advisory given  Plan Discussed with: CRNA  Anesthesia Plan Comments:       Anesthesia Quick Evaluation

## 2021-06-19 ENCOUNTER — Other Ambulatory Visit: Payer: Self-pay

## 2021-06-19 ENCOUNTER — Encounter (HOSPITAL_COMMUNITY): Payer: Self-pay | Admitting: Orthopaedic Surgery

## 2021-06-19 ENCOUNTER — Ambulatory Visit (HOSPITAL_COMMUNITY): Payer: 59

## 2021-06-19 ENCOUNTER — Encounter (HOSPITAL_COMMUNITY)
Admission: RE | Disposition: A | Discharge status: Home / Self Care | Payer: Self-pay | Source: Ambulatory Visit | Attending: Orthopaedic Surgery

## 2021-06-19 ENCOUNTER — Ambulatory Visit (HOSPITAL_COMMUNITY): Payer: 59 | Admitting: Certified Registered"

## 2021-06-19 ENCOUNTER — Ambulatory Visit (HOSPITAL_COMMUNITY)
Admission: RE | Admit: 2021-06-19 | Discharge: 2021-06-19 | Disposition: A | Discharge status: Home / Self Care | Payer: 59 | Source: Ambulatory Visit | Attending: Orthopaedic Surgery | Admitting: Orthopaedic Surgery

## 2021-06-19 DIAGNOSIS — M25852 Other specified joint disorders, left hip: Secondary | ICD-10-CM | POA: Diagnosis not present

## 2021-06-19 DIAGNOSIS — J45909 Unspecified asthma, uncomplicated: Secondary | ICD-10-CM | POA: Diagnosis not present

## 2021-06-19 DIAGNOSIS — I10 Essential (primary) hypertension: Secondary | ICD-10-CM | POA: Diagnosis not present

## 2021-06-19 DIAGNOSIS — Z419 Encounter for procedure for purposes other than remedying health state, unspecified: Secondary | ICD-10-CM

## 2021-06-19 HISTORY — PX: HIP ARTHROSCOPY: SHX668

## 2021-06-19 LAB — GLUCOSE, CAPILLARY
Glucose-Capillary: 79 mg/dL (ref 70–99)
Glucose-Capillary: 150 mg/dL — ABNORMAL HIGH (ref 70–99)

## 2021-06-19 SURGERY — ARTHROSCOPY HIP
Anesthesia: General | Site: Hip | Laterality: Left

## 2021-06-19 MED ORDER — ONDANSETRON HCL 4 MG/2ML IJ SOLN
INTRAMUSCULAR | Status: AC
  Filled 2021-06-19: qty 2

## 2021-06-19 MED ORDER — LIDOCAINE 2% (20 MG/ML) 5 ML SYRINGE
INTRAMUSCULAR | Status: DC | PRN
  Administered 2021-06-19: 80 mg via INTRAVENOUS

## 2021-06-19 MED ORDER — PHENYLEPHRINE 40 MCG/ML (10ML) SYRINGE FOR IV PUSH (FOR BLOOD PRESSURE SUPPORT)
PREFILLED_SYRINGE | INTRAVENOUS | Status: DC | PRN
Start: 2021-06-19 — End: 2021-06-19
  Administered 2021-06-19: 120 ug via INTRAVENOUS
  Administered 2021-06-19: 80 ug via INTRAVENOUS
  Administered 2021-06-19: 160 ug via INTRAVENOUS
  Administered 2021-06-19: 120 ug via INTRAVENOUS
  Administered 2021-06-19 (×4): 80 ug via INTRAVENOUS
  Administered 2021-06-19: 120 ug via INTRAVENOUS
  Administered 2021-06-19: 160 ug via INTRAVENOUS

## 2021-06-19 MED ORDER — PHENYLEPHRINE HCL-NACL 20-0.9 MG/250ML-% IV SOLN
INTRAVENOUS | Status: DC | PRN
  Administered 2021-06-19: 50 ug/min via INTRAVENOUS

## 2021-06-19 MED ORDER — EPINEPHRINE PF 1 MG/ML IJ SOLN
INTRAMUSCULAR | Status: AC
  Filled 2021-06-19: qty 2

## 2021-06-19 MED ORDER — ACETAMINOPHEN 500 MG PO TABS
ORAL_TABLET | ORAL | Status: AC
  Administered 2021-06-19: 500 mg via ORAL
  Filled 2021-06-19: qty 2

## 2021-06-19 MED ORDER — PROPOFOL 10 MG/ML IV BOLUS
INTRAVENOUS | Status: DC | PRN
  Administered 2021-06-19: 170 mg via INTRAVENOUS

## 2021-06-19 MED ORDER — SUGAMMADEX SODIUM 200 MG/2ML IV SOLN
INTRAVENOUS | Status: DC | PRN
  Administered 2021-06-19: 300 mg via INTRAVENOUS

## 2021-06-19 MED ORDER — CHLORHEXIDINE GLUCONATE 0.12 % MT SOLN
OROMUCOSAL | Status: AC
  Administered 2021-06-19: 15 mL via OROMUCOSAL
  Filled 2021-06-19: qty 15

## 2021-06-19 MED ORDER — ROCURONIUM BROMIDE 10 MG/ML (PF) SYRINGE
PREFILLED_SYRINGE | INTRAVENOUS | Status: DC | PRN
  Administered 2021-06-19: 10 mg via INTRAVENOUS
  Administered 2021-06-19: 60 mg via INTRAVENOUS

## 2021-06-19 MED ORDER — CEFAZOLIN SODIUM-DEXTROSE 2-4 GM/100ML-% IV SOLN
INTRAVENOUS | Status: AC
  Filled 2021-06-19: qty 100

## 2021-06-19 MED ORDER — CHLORHEXIDINE GLUCONATE 0.12 % MT SOLN: 15.0000 mL | Freq: Once | OROMUCOSAL | Status: AC

## 2021-06-19 MED ORDER — DEXMEDETOMIDINE (PRECEDEX) IN NS 20 MCG/5ML (4 MCG/ML) IV SYRINGE
PREFILLED_SYRINGE | INTRAVENOUS | Status: AC
  Filled 2021-06-19: qty 5

## 2021-06-19 MED ORDER — LACTATED RINGERS IV SOLN: INTRAVENOUS | Status: DC

## 2021-06-19 MED ORDER — EPHEDRINE 5 MG/ML INJ
INTRAVENOUS | Status: AC
  Filled 2021-06-19: qty 5

## 2021-06-19 MED ORDER — EPINEPHRINE PF 1 MG/ML IJ SOLN
INTRAMUSCULAR | Status: DC | PRN
  Administered 2021-06-19 (×2): 1 mg

## 2021-06-19 MED ORDER — SODIUM CHLORIDE 0.9 % IR SOLN
Status: DC | PRN
  Administered 2021-06-19 (×9): 3000 mL

## 2021-06-19 MED ORDER — CEFAZOLIN SODIUM-DEXTROSE 2-4 GM/100ML-% IV SOLN
2.0000 g | INTRAVENOUS | Status: AC
  Administered 2021-06-19: 2 g via INTRAVENOUS

## 2021-06-19 MED ORDER — DEXAMETHASONE SODIUM PHOSPHATE 10 MG/ML IJ SOLN
INTRAMUSCULAR | Status: AC
  Filled 2021-06-19: qty 1

## 2021-06-19 MED ORDER — GABAPENTIN 300 MG PO CAPS
ORAL_CAPSULE | ORAL | Status: AC
  Administered 2021-06-19: 300 mg via ORAL
  Filled 2021-06-19: qty 1

## 2021-06-19 MED ORDER — PROPOFOL 10 MG/ML IV BOLUS
INTRAVENOUS | Status: AC
  Filled 2021-06-19: qty 20

## 2021-06-19 MED ORDER — ORAL CARE MOUTH RINSE: 15.0000 mL | Freq: Once | OROMUCOSAL | Status: AC

## 2021-06-19 MED ORDER — LIDOCAINE 2% (20 MG/ML) 5 ML SYRINGE
INTRAMUSCULAR | Status: AC
  Filled 2021-06-19: qty 5

## 2021-06-19 MED ORDER — DEXMEDETOMIDINE (PRECEDEX) IN NS 20 MCG/5ML (4 MCG/ML) IV SYRINGE
PREFILLED_SYRINGE | INTRAVENOUS | Status: DC | PRN
  Administered 2021-06-19: 20 ug via INTRAVENOUS

## 2021-06-19 MED ORDER — EPHEDRINE SULFATE-NACL 50-0.9 MG/10ML-% IV SOSY
PREFILLED_SYRINGE | INTRAVENOUS | Status: DC | PRN
Start: 2021-06-19 — End: 2021-06-19
  Administered 2021-06-19: 15 mg via INTRAVENOUS
  Administered 2021-06-19: 5 mg via INTRAVENOUS
  Administered 2021-06-19 (×3): 10 mg via INTRAVENOUS

## 2021-06-19 MED ORDER — PHENYLEPHRINE 40 MCG/ML (10ML) SYRINGE FOR IV PUSH (FOR BLOOD PRESSURE SUPPORT)
PREFILLED_SYRINGE | INTRAVENOUS | Status: AC
  Filled 2021-06-19: qty 10

## 2021-06-19 MED ORDER — ROCURONIUM BROMIDE 10 MG/ML (PF) SYRINGE
PREFILLED_SYRINGE | INTRAVENOUS | Status: AC
  Filled 2021-06-19: qty 10

## 2021-06-19 MED ORDER — ONDANSETRON HCL 4 MG/2ML IJ SOLN
INTRAMUSCULAR | Status: DC | PRN
  Administered 2021-06-19: 4 mg via INTRAVENOUS

## 2021-06-19 MED ORDER — TRANEXAMIC ACID-NACL 1000-0.7 MG/100ML-% IV SOLN: 1000.0000 mg | INTRAVENOUS | Status: DC

## 2021-06-19 MED ORDER — MIDAZOLAM HCL 2 MG/2ML IJ SOLN
INTRAMUSCULAR | Status: DC | PRN
  Administered 2021-06-19: 2 mg via INTRAVENOUS

## 2021-06-19 MED ORDER — ACETAMINOPHEN 500 MG PO TABS: 1000.0000 mg | ORAL_TABLET | Freq: Once | ORAL | Status: AC

## 2021-06-19 MED ORDER — ACETAMINOPHEN 500 MG PO TABS
1000.0000 mg | ORAL_TABLET | Freq: Once | ORAL | Status: DC
Start: 2021-06-19 — End: 2021-06-19

## 2021-06-19 MED ORDER — FENTANYL CITRATE (PF) 250 MCG/5ML IJ SOLN
INTRAMUSCULAR | Status: DC | PRN
  Administered 2021-06-19: 100 ug via INTRAVENOUS
  Administered 2021-06-19: 50 ug via INTRAVENOUS
  Administered 2021-06-19: 25 ug via INTRAVENOUS

## 2021-06-19 MED ORDER — DEXAMETHASONE SODIUM PHOSPHATE 10 MG/ML IJ SOLN
INTRAMUSCULAR | Status: DC | PRN
  Administered 2021-06-19: 10 mg via INTRAVENOUS

## 2021-06-19 MED ORDER — PROMETHAZINE HCL 25 MG/ML IJ SOLN: 6.2500 mg | INTRAMUSCULAR | Status: DC | PRN

## 2021-06-19 MED ORDER — BUPIVACAINE-EPINEPHRINE (PF) 0.25% -1:200000 IJ SOLN
INTRAMUSCULAR | Status: AC
  Filled 2021-06-19: qty 30

## 2021-06-19 MED ORDER — GABAPENTIN 300 MG PO CAPS: 300.0000 mg | ORAL_CAPSULE | Freq: Once | ORAL | Status: AC

## 2021-06-19 MED ORDER — LACTATED RINGERS IV SOLN: INTRAVENOUS | Status: DC | PRN

## 2021-06-19 MED ORDER — FENTANYL CITRATE (PF) 250 MCG/5ML IJ SOLN
INTRAMUSCULAR | Status: AC
  Filled 2021-06-19: qty 5

## 2021-06-19 MED ORDER — MIDAZOLAM HCL 2 MG/2ML IJ SOLN
INTRAMUSCULAR | Status: AC
  Filled 2021-06-19: qty 2

## 2021-06-19 MED ORDER — 0.9 % SODIUM CHLORIDE (POUR BTL) OPTIME
TOPICAL | Status: DC | PRN
  Administered 2021-06-19: 1000 mL

## 2021-06-19 MED ORDER — FENTANYL CITRATE (PF) 100 MCG/2ML IJ SOLN: 25.0000 ug | INTRAMUSCULAR | Status: DC | PRN

## 2021-06-19 MED ORDER — BUPIVACAINE-EPINEPHRINE 0.25% -1:200000 IJ SOLN
INTRAMUSCULAR | Status: DC | PRN
  Administered 2021-06-19: 30 mL

## 2021-06-19 MED ORDER — TRANEXAMIC ACID-NACL 1000-0.7 MG/100ML-% IV SOLN
INTRAVENOUS | Status: AC
  Filled 2021-06-19: qty 100

## 2021-06-19 SURGICAL SUPPLY — 66 items
ANCHOR CINCHLOCK KNTLS W/INSRT (SUTURE) ×1 IMPLANT
BAG COUNTER SPONGE SURGICOUNT (BAG) IMPLANT
BIT DRILL CINCHLOCK STR F/ANCH (BIT) ×1 IMPLANT
BLADE SAMURAI STR FULL RADIUS (BLADE) ×1 IMPLANT
BLADE SURG 11 STRL SS (BLADE) ×2 IMPLANT
BUR ROUND HI FLUTE 8 4X19 (BURR) IMPLANT
BURR ROUND HI FLUTE 8 4X19 (BURR) ×2
CANNULA 8 456 TRANSPORT (CANNULA) ×1 IMPLANT
CANNULA OBTURATOR FLOWPORT ST5 (CANNULA) ×1 IMPLANT
CHLORAPREP W/TINT 26 (MISCELLANEOUS) ×2 IMPLANT
DECANTER SPIKE VIAL GLASS SM (MISCELLANEOUS) ×2 IMPLANT
DISSECTOR 4.2MMX19CM CVD HL (ORTHOPEDIC DISPOSABLE SUPPLIES) ×1 IMPLANT
DRAPE C-ARM 42X72 X-RAY (DRAPES) ×2 IMPLANT
DRAPE STERI IOBAN 125X83 (DRAPES) ×2 IMPLANT
DRAPE U-SHAPE 47X51 STRL (DRAPES) ×4 IMPLANT
DRSG TEGADERM 2-3/8X2-3/4 SM (GAUZE/BANDAGES/DRESSINGS) ×6 IMPLANT
DRSG TEGADERM 4X10 (GAUZE/BANDAGES/DRESSINGS) ×4 IMPLANT
DRSG TEGADERM 4X4.75 (GAUZE/BANDAGES/DRESSINGS) ×4 IMPLANT
DRSG XEROFORM 1X8 (GAUZE/BANDAGES/DRESSINGS) ×1 IMPLANT
GAUZE SPONGE 4X4 12PLY STRL (GAUZE/BANDAGES/DRESSINGS) ×2 IMPLANT
GAUZE XEROFORM 1X8 LF (GAUZE/BANDAGES/DRESSINGS) ×2 IMPLANT
GLOVE SRG 8 PF TXTR STRL LF DI (GLOVE) ×1 IMPLANT
GLOVE SURG ENC MOIS LTX SZ6 (GLOVE) ×6 IMPLANT
GLOVE SURG ENC MOIS LTX SZ7.5 (GLOVE) ×4 IMPLANT
GLOVE SURG SYN 7.5  E (GLOVE) ×2
GLOVE SURG SYN 7.5 E (GLOVE) ×2 IMPLANT
GLOVE SURG SYN 7.5 PF PI (GLOVE) ×2 IMPLANT
GLOVE SURG UNDER POLY LF SZ6 (GLOVE) ×2 IMPLANT
GLOVE SURG UNDER POLY LF SZ6.5 (GLOVE) ×2 IMPLANT
GLOVE SURG UNDER POLY LF SZ8 (GLOVE) ×1
GOWN STRL REUS W/ TWL LRG LVL3 (GOWN DISPOSABLE) ×1 IMPLANT
GOWN STRL REUS W/ TWL XL LVL3 (GOWN DISPOSABLE) ×1 IMPLANT
GOWN STRL REUS W/TWL LRG LVL3 (GOWN DISPOSABLE) ×1
GOWN STRL REUS W/TWL XL LVL3 (GOWN DISPOSABLE) ×1
KIT BASIN OR (CUSTOM PROCEDURE TRAY) ×2 IMPLANT
KIT HIP ARTHROSCOPY (SET/KITS/TRAYS/PACK) ×2 IMPLANT
KIT PATIENT POSITION MEDIUM (KITS) ×1 IMPLANT
KIT PORTAL ENTRY HIP ACCESS (KITS) ×1 IMPLANT
KIT TURNOVER KIT B (KITS) ×2 IMPLANT
MANIFOLD NEPTUNE II (INSTRUMENTS) ×4 IMPLANT
MARKER SKIN DUAL TIP RULER LAB (MISCELLANEOUS) ×2 IMPLANT
NDL INJECTOR II CARTRIDGE (MISCELLANEOUS) IMPLANT
NDL SPNL 18GX3.5 QUINCKE PK (NEEDLE) ×1 IMPLANT
NEEDLE INJECTOR II CARTRIDGE (MISCELLANEOUS) ×2 IMPLANT
NEEDLE SPNL 18GX3.5 QUINCKE PK (NEEDLE) ×2 IMPLANT
PACK SURGICAL SETUP 50X90 (CUSTOM PROCEDURE TRAY) ×2 IMPLANT
PAD ARMBOARD 7.5X6 YLW CONV (MISCELLANEOUS) ×4 IMPLANT
PASSER SUT 1.5D CRESCENT (INSTRUMENTS) ×1 IMPLANT
PASSER SUT 70D UP ANGLED (INSTRUMENTS) ×1 IMPLANT
SPONGE T-LAP 18X18 ~~LOC~~+RFID (SPONGE) ×2 IMPLANT
SUT ETHIBOND 3 0 SH 1 (SUTURE) ×2 IMPLANT
SUT ETHILON 4 0 PS 2 18 (SUTURE) ×2 IMPLANT
SUT FIBERWIRE #2 38 T-5 BLUE (SUTURE)
SUT XBRAID 1.4 BLUE (SUTURE) ×1 IMPLANT
SUT XBRAID II 1.4 WHITE/BLA (SUTURE) ×1 IMPLANT
SUT ZIPLINE SZ2 BLK (SUTURE) ×2 IMPLANT
SUTURE FIBERWR #2 38 T-5 BLUE (SUTURE) IMPLANT
SUTURE TAPE XBRAID 1.2 BLUE 45 (SUTURE) IMPLANT
SUTURETAPE XBRAID 1.2 BLUE 45 (SUTURE) ×2
SYR 50ML LL SCALE MARK (SYRINGE) ×2 IMPLANT
TAPE CLOTH 4X10 WHT NS (GAUZE/BANDAGES/DRESSINGS) ×2 IMPLANT
TOWEL GREEN STERILE (TOWEL DISPOSABLE) ×2 IMPLANT
TUBE CONNECTING 12X1/4 (SUCTIONS) ×4 IMPLANT
TUBING ARTHROSCOPY IRRIG 16FT (MISCELLANEOUS) ×2 IMPLANT
WAND APOLLO RF 50D ABLATOR (BUR) ×1 IMPLANT
WATER STERILE IRR 1000ML POUR (IV SOLUTION) ×2 IMPLANT

## 2021-06-19 NOTE — Discharge Instructions (Signed)
° ° °   Discharge Instructions    Attending Surgeon: Huel Cote, MD Office Phone Number: [phone]   Diagnosis and Procedures:    Surgeries Performed: Left hip arthroscopy with debridement  Discharge Plan:    Diet: Resume usual diet. Begin with light or bland foods.  Drink plenty of fluids.  Activity:  Touchdown weight bearing, utilizing crutches, until seen at postoperative Physical Therapy visit this week for 2 weeks total. Please keep your brace locked until follow-up. You are advised to go home directly from the hospital or surgical center. Restrict your activities.  GENERAL INSTRUCTIONS: 1.  Keep your ice man on the leg multiples times daily. This will improve your comfort and your overall recovery following surgery.     2. Please call Dr. Serena Croissant office at [phone] with questions Monday-Friday during business hours. If no one answers, please leave a message and someone should get back to the patient within 24 hours. For emergencies please call 911 or proceed to the emergency room.   3. Patient to notify surgical team if experiences any of the following: Bowel/Bladder dysfunction, uncontrolled pain, nerve/muscle weakness, incision with increased drainage or redness, nausea/vomiting and Fever greater than 101.0 F.  Be alert for signs of infection including redness, streaking, odor, fever or chills. Be alert for excessive pain or bleeding and notify your surgeon immediately.  WOUND INSTRUCTIONS:   Leave your dressing/cast/splint in place until your post operative visit.  Keep it clean and dry.  Always keep the incision clean and dry until the staples/sutures are removed. If there is no drainage from the incision you should keep it open to air. If there is drainage from the incision you must keep it covered at all times until the drainage stops  Do not soak in a bath tub, hot tub, pool, lake or other body of water until 21 days after your surgery and your incision is  completely dry and healed.  If you have removable sutures (or staples) they must be removed 10-14 days (unless otherwise instructed) from the day of your surgery.     1)  Elevate the extremity as much as possible.  2)  Keep the dressing clean and dry.  3)  Please call us if the dressing becomes wet or dirty.  4)  If you are experiencing worsening pain or worsening swelling, please call.     MEDICATIONS: Resume all previous home medications at the previous prescribed dose and frequency unless otherwise noted Start taking the  pain medications on an as-needed basis as prescribed  Please taper down pain medication over the next week following surgery.  Ideally you should not require a refill of any narcotic pain medication.  Take pain medication with food to minimize nausea. In addition to the prescribed pain medication, you may take over-the-counter pain relievers such as Tylenol.  Do NOT take additional tylenol if your pain medication already has tylenol in it.  Aspirin 325mg  daily for four weeks.      FOLLOWUP INSTRUCTIONS: 1. Follow up at the Physical Therapy Clinic 3-4 days following surgery. This appointment should be scheduled unless other arrangements have been made.The Physical Therapy scheduling number is [phone] if an appointment has not already been arranged.  2. Contact Dr. [phone] office during office hours at [phone] or the practice after hours line at [phone] for non-emergencies. For medical emergencies call 911.   Discharge Location: Home

## 2021-06-19 NOTE — Interval H&P Note (Signed)
History and Physical Interval Note:  06/19/2021 7:06 AM  Max [name]  has presented today for surgery, with the diagnosis of Left hip impingement.  The various methods of treatment have been discussed with the patient and family. After consideration of risks, benefits and other options for treatment, the patient has consented to  Procedure(s): ARTHROSCOPY LEFT HIP WITH LABRAL REPAIR, CARM AND PINOR DEBRIDEMENT (Left) as a surgical intervention.  The patient's history has been reviewed, patient examined, no change in status, stable for surgery.  I have reviewed the patient's chart and labs.  Questions were answered to the patient's satisfaction.     Huel Cote

## 2021-06-19 NOTE — Anesthesia Postprocedure Evaluation (Signed)
Anesthesia Post Note  Patient: Max [name]  Procedure(s) Performed: ARTHROSCOPY LEFT HIP WITH LABRAL REPAIR, CARM AND PINOR DEBRIDEMENT (Left: Hip)     Patient location during evaluation: PACU Anesthesia Type: General Level of consciousness: awake and alert Pain management: pain level controlled Vital Signs Assessment: post-procedure vital signs reviewed and stable Respiratory status: spontaneous breathing, nonlabored ventilation and respiratory function stable Cardiovascular status: blood pressure returned to baseline and stable Postop Assessment: no apparent nausea or vomiting Anesthetic complications: no   No notable events documented.  Last Vitals:  Vitals:   06/19/21 1150 06/19/21 1205  BP: 122/81 121/76  Pulse: 75 77  Resp: 13 15  Temp:    SpO2: 95% 95%    Last Pain:  Vitals:   06/19/21 1150  TempSrc:   PainSc: 0-No pain                 Collene Schlichter

## 2021-06-19 NOTE — Anesthesia Procedure Notes (Signed)
Procedure Name: Intubation Date/Time: 06/19/2021 7:44 AM Performed by: Tressie Ellis, CRNA Pre-anesthesia Checklist: Patient identified, Emergency Drugs available, Suction available and Patient being monitored Patient Re-evaluated:Patient Re-evaluated prior to induction Oxygen Delivery Method: Circle system utilized Preoxygenation: Pre-oxygenation with 100% oxygen Induction Type: IV induction Ventilation: Mask ventilation without difficulty Laryngoscope Size: Miller and 2 Grade View: Grade I Tube type: Oral Tube size: 7.0 mm Number of attempts: 1 Airway Equipment and Method: Stylet and Oral airway Placement Confirmation: ETT inserted through vocal cords under direct vision, positive ETCO2 and breath sounds checked- equal and bilateral Tube secured with: Tape Dental Injury: Teeth and Oropharynx as per pre-operative assessment

## 2021-06-19 NOTE — Op Note (Signed)
Date of Surgery: 06/19/2021  INDICATIONS: Mr. Ice is a 61 y.o.-year-old male with left hip impingement that is failed multiple nonoperative treatments.  The risk and benefits of the procedure with discussed in detail and documented in the pre-operative evaluation.  PREOPERATIVE DIAGNOSIS: 1.  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  PROCEDURE: 1.  Left hip arthroscopy with labral repair 2.  Left hip pincer debridement 3.  Left hip cam debridement  SURGEON: Yevonne Pax MD  ASSISTANT: Raynelle Fanning, ATC; necessary for the timely completion of procedure and due to complexity of procedure.  ANESTHESIA:  general  IV FLUIDS AND URINE: See anesthesia record.  ANTIBIOTICS: Ancef 2 g  ESTIMATED BLOOD LOSS: 10 mL.  IMPLANTS:  * No implants in log *  DRAINS: None  CULTURES: None  COMPLICATIONS: none  DESCRIPTION OF PROCEDURE:  Patient was identified in the preoperative holding area.  The correct site was marked according universal protocol.  Using MAC the operating room.  In the operating room anesthesia was induced.  He was prepped and draped in the usual sterile fashion.  He was placed on the Hana table with traction post.  All bony prominences were padded.  Ancef was given 1 hour prior to skin incision.  Final timeout was held.  Began with a standard anterior superior portal anterior to the greater trochanter.  10 cc of air was used to insufflate the joint under fluoroscopic guidance.  Protraction then followed by 4 turns of fine traction was utilized to dislocate the hip.  Ultimately traction time was 94 minutes.  After the needle was used to localize the anterior superior portal, nitinol wire was placed an 11 blade was used to make skin incision.  We dilated over the nitinol wire and subsequently placed a flow port cannula.  At this time the camera was introduced into the  joint.  The anterior portal was made under direct visualization again with needle, nitinol wire, dilation and a flow port cannula.  At this time the samurai knife was introduced into the joint from the anterior portal and ended up for a capsulotomy was created.  Cautery was used to help open his capsulotomy.  Diagnostic arthroscopy ensued.  There were grade 2 diffuse changes in the acetabulum.  Grade 1 changes involving the femoral head.  The labrum was frayed and torn from the 1:00 to 3 o'clock position of the hip.  There was intrasubstance calcification of the labrum as well as degeneration.  There was both a noticeable pincer and cam deformity.  There was fraying of the labrum into the hip joint itself.  At this time a probe was introduced into the anterior portal and fluoroscopic visualization was used to perform a pincer debridement.  This was done back to a healthy rim.  At this time the remaining labral tissue was significant quality.  As result electrocautery was used to perform thermal shrinkage of the labrum in order to prepare and tighten.  After this the labrum is no longer impinging in the joint itself.  There is a good suction seal of the hip following removal of traction.  At this time a distal anterior lateral portal was established under needle visualization followed by nitinol wire an 11 blade to make skin incision.  We dilated over this and introduced a transport portal.  At this time a cam resection was performed.  The hip was brought into  various degrees of flexion external rotation and internal rotation in order to make sure there is a comprehensive Debridement under fluoroscopic visualizations with appropriate alpha angle following.  The hip was taken dynamically through range of motion following Debridement with the bur and found to have no residual impingement.  Capsular closure was performed.  This was done with a slingshot and a #2 tape in a simple fashion x3.  This time we are happy  with capsular approximation.  The wounds were evacuated.  Quarter percent Marcaine without epinephrine was infiltrated in the wounds, 30 cc worth.  Wounds were closed with 3-0 nylon and Tegaderm and gauze were applied.  Hip brace was then applied.  Raynelle Fanning ATC was necessary for opening, closing, retracting, limb positioning and overall facilitation and timely completion of the procedure.     POSTOPERATIVE PLAN: The patient will be touchdown weightbearing on the left lower extremity in his brace.  He will use crutches to keep weight off of this.  He will see me back in 2 weeks for suture removal.  Yevonne Pax, MD 11:44 AM

## 2021-06-19 NOTE — Transfer of Care (Signed)
Immediate Anesthesia Transfer of Care Note  Patient: Max [name]  Procedure(s) Performed: ARTHROSCOPY LEFT HIP WITH LABRAL REPAIR, CARM AND PINOR DEBRIDEMENT (Left: Hip)  Patient Location: PACU  Anesthesia Type:General  Level of Consciousness: sedated  Airway & Oxygen Therapy: Patient Spontanous Breathing and Patient connected to face mask oxygen  Post-op Assessment: Report given to RN and Post -op Vital signs reviewed and stable  Post vital signs: Reviewed and stable  Last Vitals:  Vitals Value Taken Time  BP 108/65 06/19/21 1135  Temp    Pulse 76 06/19/21 1136  Resp 14 06/19/21 1136  SpO2 98 % 06/19/21 1136  Vitals shown include unvalidated device data.  Last Pain:  Vitals:   06/19/21 0557  TempSrc: Oral  PainSc:          Complications: No notable events documented.

## 2021-06-19 NOTE — Brief Op Note (Signed)
° °  Brief Op Note  Date of Surgery: 06/19/2021  Preoperative Diagnosis: Left hip impingement  Postoperative Diagnosis: same  Procedure: Procedure(s): ARTHROSCOPY LEFT HIP WITH LABRAL REPAIR, CARM AND PINOR DEBRIDEMENT  Implants: * No implants in log *  Surgeons: Surgeon(s): Vanetta Mulders, MD  Anesthesia: Regional    Estimated Blood Loss: See anesthesia record  Complications: None  Condition to PACU: Stable  Yevonne Pax, MD 06/19/2021 11:44 AM

## 2021-06-20 ENCOUNTER — Encounter: Payer: 59 | Admitting: Family Medicine

## 2021-06-22 ENCOUNTER — Ambulatory Visit (HOSPITAL_BASED_OUTPATIENT_CLINIC_OR_DEPARTMENT_OTHER): Payer: 59 | Attending: Orthopaedic Surgery | Admitting: Physical Therapy

## 2021-06-22 ENCOUNTER — Other Ambulatory Visit: Payer: Self-pay

## 2021-06-22 ENCOUNTER — Encounter (HOSPITAL_BASED_OUTPATIENT_CLINIC_OR_DEPARTMENT_OTHER): Payer: Self-pay | Admitting: Physical Therapy

## 2021-06-22 DIAGNOSIS — R2689 Other abnormalities of gait and mobility: Secondary | ICD-10-CM | POA: Diagnosis not present

## 2021-06-22 DIAGNOSIS — M25562 Pain in left knee: Secondary | ICD-10-CM | POA: Insufficient documentation

## 2021-06-22 DIAGNOSIS — M25652 Stiffness of left hip, not elsewhere classified: Secondary | ICD-10-CM

## 2021-06-22 DIAGNOSIS — M25852 Other specified joint disorders, left hip: Secondary | ICD-10-CM | POA: Diagnosis not present

## 2021-06-22 DIAGNOSIS — M25552 Pain in left hip: Secondary | ICD-10-CM

## 2021-06-22 NOTE — Therapy (Signed)
OUTPATIENT PHYSICAL THERAPY LOWER EXTREMITY EVALUATION   Patient Name: Max [name] MRN: [id number] DOB:[birth year], 61 y.o., male Today's Date: 06/22/2021   PT End of Session - 06/22/21 0909     Visit Number 1    Number of Visits 24    Date for PT Re-Evaluation 09/14/21    PT Start Time 0845    PT Stop Time 0933    PT Time Calculation (min) 48 min    Activity Tolerance Patient tolerated treatment well    Behavior During Therapy [facility] for tasks assessed/performed             Past Medical History:  Diagnosis Date   Abnormal renal function    states labs were elevated due to taking ANSAIDS-he is treated by a urologist in [place]    Chronic kidney disease    stage 2   Hamstring tear    left   Hypertension    Pre-diabetes    Testosterone deficiency    treated by [facility] MD-[place]   Past Surgical History:  Procedure Laterality Date   NO PAST SURGERIES     Patient Active Problem List   Diagnosis Date Noted   Hip impingement syndrome, left    Left hip pain 12/28/2020   Allergy-induced asthma 09/10/2019   Hypertension 09/03/2019   Low testosterone 09/03/2019    PCP: Caren Macadam, MD  REFERRING PROVIDER: Vanetta Mulders, MD  REFERRING DIAG: [phone] (ICD-10-CM) - Hip impingement syndrome, left  THERAPY DIAG:  Left Hip Pain  Lift Hip Stiffness  Other Abnormalities of Gait and Mobility     ONSET DATE: 2/72022   SUBJECTIVE:   SUBJECTIVE STATEMENT: The patient had a hip labral repair and debridement on 06/22/2021. At this time his pain is well controlled. He is using crutces and it non-weight bearing.   PERTINENT HISTORY: Remote hamstring tear; CKD, Arthritis of the left hip; Pre-diabetes,   PAIN:  Are you having pain? Yes NPRS scale: 6/10 right now 8/10 at worst  Pain location: Anterior left hip  Pain orientation: Left  PAIN TYPE: aching Pain description: intermittent  Aggravating factors: Standing and walking/  siting for too long  Relieving factors: Rest/ medication   PRECAUTIONS: Other: follow hip arthroscopy protocol   WEIGHT BEARING RESTRICTIONS Yes NWB at this time   FALLS:  Has patient fallen in last 6 months? No, Number of falls:   LIVING ENVIRONMENT: 2 steps into the door  Patient using crutches to get up them at this time OCCUPATION:  Drives a forklift   Recrational activity: Went to the gym    PLOF: Independent  PATIENT GOALS :   Get back to normal    OBJECTIVE:   DIAGNOSTIC FINDINGS: Nothing post op   PATIENT SURVEYS:  FOTO 41% ability 78% expected in 17 visits   COGNITION:  Overall cognitive status: Within functional limits for tasks assessed     SENSATION:  Light touch: Appears intact  Stereognosis: Appears intact  Hot/Cold: Appears intact  Proprioception: Appears intact  MUSCLE LENGTH:  POSTURE:  Good   PALPATION: No unexpected tenderness to palpation   LE AROM/PROM:  PROM Right 06/22/2021 Left 06/22/2021  Hip flexion  80  Hip extension    Hip abduction  8  Hip adduction    Hip internal rotation  5   Hip external rotation  5  Knee flexion    Knee extension    Ankle dorsiflexion    Ankle plantarflexion    Ankle inversion  Ankle eversion     (Blank rows = not tested)  LE MMT:  MMT Right 06/22/2021 Left 06/22/2021  Hip flexion    Hip extension    Hip abduction    Hip adduction    Hip internal rotation    Hip external rotation    Knee flexion    Knee extension    Ankle dorsiflexion    Ankle plantarflexion    Ankle inversion    Ankle eversion     (Blank rows = not tested) MMT not tested 2nd to recent surgery   FUNCTIONAL TESTS:  Needs to use hands or assitance to transfer pout of a chair   GAIT: NWB walking with crutches    TODAY'S TREATMENT: Manual: Gentle PROM into all planes allowed following protocol restrictions    PATIENT EDUCATION:  Education details: HEP/ Symptom management/ Expected progression  Person  educated: Patient and Spouse Education method: Explanation, Demonstration, Tactile cues, Verbal cues, and Handouts Education comprehension: verbalized understanding, returned demonstration, verbal cues required, tactile cues required, and needs further education   HOME EXERCISE PROGRAM:   ASSESSMENT:  CLINICAL IMPRESSION: Patient is a 61 y.o. male who was seen today 2nd to S/P left hip labral repair and debridment on 2/7. He presents with expected limitations in motion, strength, and functional mobility. His pain is well controlled. He is not weight bearing per protocol until week 3 or until MD advises. 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   Objective impairments include Abnormal gait, decreased activity tolerance, decreased endurance, decreased knowledge of use of DME, difficulty walking, decreased ROM, decreased strength, increased muscle spasms, and pain. These impairments are limiting patient from cleaning, driving, meal prep, occupation, yard work, and shopping. Personal factors including 1-2 comorbidities: arthritis   are also affecting patient's functional outcome. Patient will benefit from skilled PT to address above impairments and improve overall function.  REHAB POTENTIAL: Excellent  CLINICAL DECISION MAKING: Stable/uncomplicated  EVALUATION COMPLEXITY: Low   GOALS: Goals reviewed with patient? Yes   SHORT TERM GOALS:  STG Name Target Date Goal status  1 Patient will increase hip flexion to 90 degrees  Baseline:  07/13/2021 INITIAL  2 Patient will be independent with basic HEP  Baseline:  07/13/2021 INITIAL  3 Patient will begin weight bearing per Protocol Baseline: 07/13/2021 INITIAL  LONG TERM GOALS:   LTG Name Target Date Goal status  1 Patient will progress off crutches to full weight bearing as tolerated  Baseline: 08/17/2021 INITIAL  2 Patient will go up and down 8 steps with reciprocal gait   Baseline: 08/17/2021 INITIAL  3 Further functional goals will be established as protocol progresses  Baseline: 08/17/2021 INITIAL  PLAN: PT FREQUENCY: 2x/week  PT DURATION: 8 weeks  PLANNED INTERVENTIONS: Therapeutic exercises, Therapeutic activity, Neuro Muscular re-education, Balance training, Gait training, Patient/Family education, Joint mobilization, Stair training, Aquatic Therapy, Electrical stimulation, Cryotherapy, Moist heat, Ultrasound, and Manual therapy  PLAN FOR NEXT SESSION: follow protocol; add in exercises for HEP; Manual therapy to the hip   Carney Living, PT DPT  06/22/2021, 2:29 PM

## 2021-06-25 NOTE — Therapy (Signed)
OUTPATIENT PHYSICAL THERAPY TREATMENT NOTE   Patient Name: Max [name] MRN: [id number] DOB:[birth year], 61 y.o., male Today's Date: 06/26/2021  PCP: Wynn Banker, MD    PT End of Session - 06/26/21 1110     Visit Number 2    Number of Visits 24    Date for PT Re-Evaluation 09/14/21    PT Start Time 1024    PT Stop Time 1104    PT Time Calculation (min) 40 min    Equipment Utilized During Treatment Other (comment)   bilat crutches   Activity Tolerance Patient tolerated treatment well    Behavior During Therapy WFL for tasks assessed/performed             Past Medical History:  Diagnosis Date   Abnormal renal function    states labs were elevated due to taking ANSAIDS-he is treated by a urologist in [place]   Arthritis    Chronic kidney disease    stage 2   Hamstring tear    left   Hypertension    Pre-diabetes    Testosterone deficiency    treated by [facility] MD-[place]   Past Surgical History:  Procedure Laterality Date   NO PAST SURGERIES     Patient Active Problem List   Diagnosis Date Noted   Hip impingement syndrome, left    Left hip pain 12/28/2020   Allergy-induced asthma 09/10/2019   Hypertension 09/03/2019   Low testosterone 09/03/2019    REFERRING PROVIDER: Huel Cote, MD   REFERRING DIAG: [phone] (ICD-10-CM) - Hip impingement syndrome, left   THERAPY DIAG:  Left Hip Pain  Lift Hip Stiffness  Other Abnormalities of Gait and Mobility        ONSET DATE: 2/72022     SUBJECTIVE:    SUBJECTIVE STATEMENT: Pt's wife present during Rx.  Pt is 1 week s/p L hip labral repair and debridement on 06/19/2021.  Pt denies any adverse effects after prior Rx.  Pt reports he has been doing some of his HEP since prior visit.  Pt states he has been following Wb'ing precautions though did put pressure on his L foot one time while in the [employer].  Pt states MD stated he could put his toes down.  He is using crutches and is non-weight  bearing.    PERTINENT HISTORY: Remote hamstring tear; CKD, Arthritis of the left hip; Pre-diabetes,    PAIN:  Are you having pain? Yes NPRS scale: 5/10 right now 8/10 at worst  Pain location: Anterior left hip  Pain orientation: Left  PAIN TYPE: aching Pain description: intermittent  Aggravating factors: Standing and walking/ siting for too long  Relieving factors: Rest/ medication    PRECAUTIONS: Other: follow hip arthroscopy protocol    WEIGHT BEARING RESTRICTIONS Yes NWB at this time        OCCUPATION:  Drives a forklift    Recrational activity: Went to the gym      PLOF: Independent   PATIENT GOALS :    Get back to normal      OBJECTIVE:    DIAGNOSTIC FINDINGS: Nothing post op      TODAY'S TREATMENT:       OBSERVATION: Pt in brace and using bilat crutches.  PT assessed his dressing.  Pt had dried blood on guaze and has no drainage.  Incisions/portals intact with stitches and xeroform gauze over them.  Pt had no signs of infection.  PT removed guaze and tegaderm dressings and applied new tegaderm and gauze  over incisions/portals.             GAIT: NWB walking with crutches      Therapeutic Exercise: -PT reviewed HEP.   -Pt performed quad sets with 5 sec hold approx 12 reps, glute sets x 15 reps, and TrA contraction with 5 sec hold in supine with L LE elevated minimally with towels. -Pt received L hip PROM in flexion, abd, and circumduction per protocol restrictions and Pt tolerance.     PATIENT EDUCATION:  Education details: Instructed pt in [place] precautions including instructed him not to apply weight thru foot.  Exercise form.  Reviewed HEP.  Answered Pt's questions.  Person educated: Patient and Spouse Education method: Explanation, Demonstration, Tactile cues, Verbal cues, and Handouts Education comprehension: verbalized understanding, returned demonstration, verbal cues required, tactile cues required, and needs further education     HOME  EXERCISE PROGRAM:     ASSESSMENT:   CLINICAL IMPRESSION: Patient is 1 week S/P left hip labral repair and debridment on 2/7.  PT changed his dressing and applied new tegaderm and gauze.  His incisions and portals look well having no signs of infection.  Pt performed home exercises well with cuing and instruction in correct form.  Pt has pain lying supine and felt better with L LE elevated minimally with towels.  Pt tolerated PROM per protocol well.  He responded well to Rx having no increased pain after Rx.  He would benefit from skilled therapy to address his goals and impairments and to assist in returning to PLOF.     Objective impairments include Abnormal gait, decreased activity tolerance, decreased endurance, decreased knowledge of use of DME, difficulty walking, decreased ROM, decreased strength, increased muscle spasms, and pain. These impairments are limiting patient from cleaning, driving, meal prep, occupation, yard work, and shopping. Personal factors including 1-2 comorbidities: arthritis   are also affecting patient's functional outcome. Patient will benefit from skilled PT to address above impairments and improve overall function.   REHAB POTENTIAL: Excellent   CLINICAL DECISION MAKING: Stable/uncomplicated   EVALUATION COMPLEXITY: Low     GOALS: Goals reviewed with patient? Yes    SHORT TERM GOALS:   STG Name Target Date Goal status  1 Patient will increase hip flexion to 90 degrees  Baseline:  07/13/2021 INITIAL  2 Patient will be independent with basic HEP  Baseline:  07/13/2021 INITIAL  3 Patient will begin weight bearing per Protocol Baseline: 07/13/2021 INITIAL  LONG TERM GOALS:    LTG Name Target Date Goal status  1 Patient will progress off crutches to full weight bearing as tolerated  Baseline: 08/17/2021 INITIAL  2 Patient will go up and down 8 steps with reciprocal gait  Baseline: 08/17/2021 INITIAL  3 Further functional goals will be established as protocol  progresses  Baseline: 08/17/2021 INITIAL  PLAN: PT FREQUENCY: 2x/week   PT DURATION: 8 weeks   PLANNED INTERVENTIONS: Therapeutic exercises, Therapeutic activity, Neuro Muscular re-education, Balance training, Gait training, Patient/Family education, Joint mobilization, Stair training, Aquatic Therapy, Electrical stimulation, Cryotherapy, Moist heat, Ultrasound, and Manual therapy   PLAN FOR NEXT SESSION: Cont per Dr. Eddie Dibbles Hip labral repair protocol.  Cont with hip PROM and exercises per protocol.    Selinda Michaels III PT, DPT 06/26/21 3:35 PM

## 2021-06-26 ENCOUNTER — Ambulatory Visit (HOSPITAL_BASED_OUTPATIENT_CLINIC_OR_DEPARTMENT_OTHER): Payer: 59 | Admitting: Physical Therapy

## 2021-06-26 ENCOUNTER — Encounter (HOSPITAL_BASED_OUTPATIENT_CLINIC_OR_DEPARTMENT_OTHER): Payer: Self-pay | Admitting: Physical Therapy

## 2021-06-26 ENCOUNTER — Other Ambulatory Visit: Payer: Self-pay

## 2021-06-26 DIAGNOSIS — M25652 Stiffness of left hip, not elsewhere classified: Secondary | ICD-10-CM

## 2021-06-26 DIAGNOSIS — M25552 Pain in left hip: Secondary | ICD-10-CM

## 2021-06-26 DIAGNOSIS — R2689 Other abnormalities of gait and mobility: Secondary | ICD-10-CM

## 2021-06-26 DIAGNOSIS — M25852 Other specified joint disorders, left hip: Secondary | ICD-10-CM | POA: Diagnosis not present

## 2021-06-28 ENCOUNTER — Ambulatory Visit (HOSPITAL_BASED_OUTPATIENT_CLINIC_OR_DEPARTMENT_OTHER): Payer: 59 | Admitting: Physical Therapy

## 2021-07-02 ENCOUNTER — Other Ambulatory Visit: Payer: Self-pay | Admitting: Family Medicine

## 2021-07-04 ENCOUNTER — Other Ambulatory Visit: Payer: Self-pay

## 2021-07-04 ENCOUNTER — Other Ambulatory Visit (HOSPITAL_BASED_OUTPATIENT_CLINIC_OR_DEPARTMENT_OTHER): Payer: Self-pay

## 2021-07-04 ENCOUNTER — Encounter (HOSPITAL_BASED_OUTPATIENT_CLINIC_OR_DEPARTMENT_OTHER): Payer: Self-pay | Admitting: Physical Therapy

## 2021-07-04 ENCOUNTER — Ambulatory Visit (INDEPENDENT_AMBULATORY_CARE_PROVIDER_SITE_OTHER): Payer: 59 | Admitting: Orthopaedic Surgery

## 2021-07-04 ENCOUNTER — Ambulatory Visit (HOSPITAL_BASED_OUTPATIENT_CLINIC_OR_DEPARTMENT_OTHER): Payer: 59 | Admitting: Physical Therapy

## 2021-07-04 DIAGNOSIS — M25552 Pain in left hip: Secondary | ICD-10-CM

## 2021-07-04 DIAGNOSIS — R2689 Other abnormalities of gait and mobility: Secondary | ICD-10-CM

## 2021-07-04 DIAGNOSIS — M25652 Stiffness of left hip, not elsewhere classified: Secondary | ICD-10-CM

## 2021-07-04 DIAGNOSIS — M25852 Other specified joint disorders, left hip: Secondary | ICD-10-CM

## 2021-07-04 MED ORDER — FERROUS SULFATE 325 (65 FE) MG PO TABS
325.0000 mg | ORAL_TABLET | Freq: Every day | ORAL | 0 refills | Status: DC
  Filled 2021-07-04: qty 30, 30d supply, fill #0

## 2021-07-04 MED ORDER — ACETAMINOPHEN 500 MG PO TABS
500.0000 mg | ORAL_TABLET | Freq: Three times a day (TID) | ORAL | 0 refills | Status: AC
  Filled 2021-07-04: qty 30, 10d supply, fill #0

## 2021-07-04 NOTE — Therapy (Signed)
OUTPATIENT PHYSICAL THERAPY TREATMENT NOTE   Patient Name: Max [name] MRN: [id number] DOB:07/20/1960, 61 y.o., male Today's Date: 07/04/2021  PCP: Wynn Banker, MD    PT End of Session - 07/04/21 [phone]     Visit Number 3    Number of Visits 24    Date for PT Re-Evaluation 09/14/21    PT Start Time 0803    PT Stop Time 0848    PT Time Calculation (min) 45 min    Equipment Utilized During Treatment Other (comment)   bilat crutches   Activity Tolerance Patient tolerated treatment well    Behavior During Therapy [facility] for tasks assessed/performed              Past Medical History:  Diagnosis Date   Abnormal renal function    states labs were elevated due to taking ANSAIDS-he is treated by a urologist in [place]   Arthritis    Chronic kidney disease    stage 2   Hamstring tear    left   Hypertension    Pre-diabetes    Testosterone deficiency    treated by Delrae Rend [place],   Past Surgical History:  Procedure Laterality Date   HIP ARTHROSCOPY Left 06/19/2021   Procedure: ARTHROSCOPY LEFT HIP WITH LABRAL REPAIR, CARM AND PINOR DEBRIDEMENT;  Surgeon: Huel Cote, MD;  Location: [facility] OR;  Service: Orthopedics;  Laterality: Left;   NO PAST SURGERIES     Patient Active Problem List   Diagnosis Date Noted   Hip impingement syndrome, left    Left hip pain 12/28/2020   Allergy-induced asthma 09/10/2019   Hypertension 09/03/2019   Low testosterone 09/03/2019    REFERRING PROVIDER: Huel Cote, MD   REFERRING DIAG: [phone] (ICD-10-CM) - Hip impingement syndrome, left   THERAPY DIAG:  Left Hip Pain  Lift Hip Stiffness  Other Abnormalities of Gait and Mobility        ONSET DATE: 2/72022     SUBJECTIVE:    SUBJECTIVE STATEMENT: Pt's wife present during Rx.  Pt is 2 weeks and 1 day s/p L hip labral repair and debridement on 06/19/2021.  Pt denies any adverse effects after prior Rx.  Pt reports he has been doing his HEP somewhat though  has been a "little lazy" since prior visit.  Pt states his shoulders have been bothering him.   Pt states he put a little weight on his LE in his home a couple of times and felt fine.  Surgeon came into PT clinic and spoke with Pt.  Surgeon informed PT to let pt put a little weight on L LE.  Pt reports he hasn't been taking much of the pain meds.    PERTINENT HISTORY: Remote hamstring tear; CKD, Arthritis of the left hip; Pre-diabetes,    PAIN:  Are you having pain? Yes NPRS scale: 4/10 current 5/10 at worst  Pain location: Anterior left hip  Pain orientation: Left  PAIN TYPE: aching Pain description: intermittent  Aggravating factors: Standing and walking/ siting for too long  Relieving factors: Rest/ medication    PRECAUTIONS: Other: follow hip arthroscopy protocol    WEIGHT BEARING RESTRICTIONS Yes  touchdown weightbearing on the left lower extremity in his brace per op note     OCCUPATION:  Drives a forklift    Recrational activity: Went to the gym      PLOF: Independent   PATIENT GOALS :    Get back to normal      OBJECTIVE:  TODAY'S TREATMENT:       OBSERVATION: Pt in brace and using bilat crutches.  PT assessed his dressing.  Dressings were were dry and had no drainage.                       GAIT TRAINING: Pt ambulated TDWB'ing with bilat crutches with instruction and cuing for gait sequencing, stepping including appropriate step length, and WB'ing precautions.  Adjusted height of crutches for appropriate fit and educated pt.       Therapeutic Exercise: -PT reviewed HEP and gave pt a HEP handout.    -Pt performed: Upright bike in appropriate protocol ranges x 3 mins with no resistance quad sets with 5 sec hold approx 12 reps, glute sets x 15 reps, and TrA contraction with 5 sec hold in supine with L LE elevated on pillow except with quad sets Ankle pumps Prone lying x 2 mins Prone knee flexion 2 x 10 reps -Pt received L hip PROM in flexion, abd, ER, and  circumduction per protocol restrictions and Pt tolerance.   PATIENT EDUCATION:  Education details: Instructed pt in [place] precautions per op note and also what MD said today.  Exercise form.  Reviewed HEP and gave pt a HEP handout.  Educated pt in correct form and appropriate frequency.  Answered Pt's questions.  Person educated: Patient and Spouse Education method: Explanation, Demonstration, Tactile cues, Verbal cues, and Handouts Education comprehension: verbalized understanding, returned demonstration, verbal cues required, tactile cues required, and needs further education     HOME EXERCISE PROGRAM:  Access Code: [email or web address] URL: [email or web address] Date: 07/04/2021 Prepared by: Ronny Flurry  Exercises Supine Quadricep Sets - 2 x daily - 7 x weekly - 2 sets - 10 reps - 5 seconds hold Supine Gluteal Sets - 2 x daily - 7 x weekly - 2 sets - 10 reps - 5 hold Supine Transversus Abdominis Bracing - Hands on Stomach - 2 x daily - 7 x weekly - 2 sets - 10 reps - 5 second hold    ASSESSMENT:   CLINICAL IMPRESSION: Patient is 2 weeks and 1 day s/p left hip labral repair and debridment on 2/7.  Pt has been doing some of his HEP though not completely compliant.  PT encouraged pt to be compliant with HEP and gave a handout today.  Pt reports increased shoulder pain with crutches.  PT adjusted height of crutches to appropriate fit and pt reports feeling better with gait.  MD instructed PT to allow pt to put some weight thru L LE.  Pt required instruction in gait for appropriate Wb'ing and gait sequencing.  Pt able to perform without increased pain.  Pt tolerated PROM per protocol well and demonstrates improved PROM compared to prior Rx based upon visual observation.  Pt performed exercises per protocol well without c/o's with instruction and cuing for correct form.  He responded well to Rx having no increased pain after Rx.  He will benefit from continued skilled therapy to address  his goals and impairments and to assist in returning to PLOF.     Objective impairments include Abnormal gait, decreased activity tolerance, decreased endurance, decreased knowledge of use of DME, difficulty walking, decreased ROM, decreased strength, increased muscle spasms, and pain. These impairments are limiting patient from cleaning, driving, meal prep, occupation, yard work, and shopping. Personal factors including 1-2 comorbidities: arthritis   are also affecting patient's functional outcome. Patient will benefit from skilled PT to address above impairments and  improve overall function.   REHAB POTENTIAL: Excellent   CLINICAL DECISION MAKING: Stable/uncomplicated   EVALUATION COMPLEXITY: Low     GOALS:   SHORT TERM GOALS:   STG Name Target Date Goal status  1 Patient will increase hip flexion to 90 degrees  Baseline:  07/13/2021 INITIAL  2 Patient will be independent with basic HEP  Baseline:  07/13/2021 INITIAL  3 Patient will begin weight bearing per Protocol Baseline: 07/13/2021 INITIAL  LONG TERM GOALS:    LTG Name Target Date Goal status  1 Patient will progress off crutches to full weight bearing as tolerated  Baseline: 08/17/2021 INITIAL  2 Patient will go up and down 8 steps with reciprocal gait  Baseline: 08/17/2021 INITIAL  3 Further functional goals will be established as protocol progresses  Baseline: 08/17/2021 INITIAL  PLAN: PT FREQUENCY: 2x/week   PT DURATION: 8 weeks   PLANNED INTERVENTIONS: Therapeutic exercises, Therapeutic activity, Neuro Muscular re-education, Balance training, Gait training, Patient/Family education, Joint mobilization, Stair training, Aquatic Therapy, Electrical stimulation, Cryotherapy, Moist heat, Ultrasound, and Manual therapy   PLAN FOR NEXT SESSION: Cont per Dr. Eddie Dibbles Hip labral repair protocol.  Cont with hip PROM and exercises per protocol.    Selinda Michaels III PT, DPT 07/04/21 11:00 PM

## 2021-07-04 NOTE — Progress Notes (Signed)
Post Operative Evaluation    Procedure/Date of Surgery: left hip labral debridement with cam and pincer debridement June 19, 2021  Interval History:    Presents today 2 weeks status post the above procedure.  Overall he is doing extremely well.  He has been compliant with nonweightbearing status.  He has been using his brace.  Has been using crutches.  Has been compliant with aspirin.  He is hopeful to be able to give blood soon.  He has remained out of work   PMH/PSH/Family History/Social History/Meds/Allergies:    Past Medical History:  Diagnosis Date   Abnormal renal function    states labs were elevated due to taking ANSAIDS-he is treated by a urologist in [place] kidney disease    stage 2   Hamstring tear    left   Hypertension    Pre-diabetes    Testosterone deficiency    treated by [facility] [place],   Past Surgical History:  Procedure Laterality Date   HIP ARTHROSCOPY Left 06/19/2021   Procedure: ARTHROSCOPY LEFT HIP WITH LABRAL REPAIR, CARM AND PINOR DEBRIDEMENT;  Surgeon: Vanetta Mulders, MD;  Location: [place];  Service: Orthopedics;  Laterality: Left;   NO PAST SURGERIES     Social History   Socioeconomic History   Marital status: Single    Spouse name: Not on file   Number of children: Not on file   Years of education: Not on file   Highest education level: Not on file  Occupational History   Not on file  Tobacco Use   Smoking status: Never   Smokeless tobacco: Never  Vaping Use   Vaping Use: Never used  Substance and Sexual Activity   Alcohol use: Not Currently    Comment: quit at age 48   Drug use: Never   Sexual activity: Yes  Other Topics Concern   Not on file  Social History Narrative   Not on file   Social Determinants of Health   Financial Resource Strain: Not on file  Food Insecurity: Not on file  Transportation Needs: Not on file  Physical Activity: Not on file   Stress: Not on file  Social Connections: Not on file   Family History  Problem Relation Age of Onset   Hypertension Mother    Hypertension Father    Bowel Disease Father        obstruction   Diabetes Sister    Hypertension Sister    Hypertension Sister    Allergies  Allergen Reactions   Ibuprofen     Affects kidney function significantly   Current Outpatient Medications  Medication Sig Dispense Refill   acetaminophen (TYLENOL) 500 MG tablet Take 500 mg by mouth every 8 (eight) hours as needed for moderate pain.     amLODipine (NORVASC) 10 MG tablet Take 1 tablet (10 mg total) by mouth daily. 90 tablet 3   aspirin EC 325 MG tablet Take 1 tablet (325 mg total) by mouth daily. 30 tablet 0   Blood Pressure Monitoring (ADULT BLOOD PRESSURE CUFF LG) KIT 1 Device by Does not apply route daily as needed. 1 kit 0   carboxymethylcellulose (REFRESH PLUS) 0.5 % SOLN 1 drop 3 (three) times daily as needed (dry eyes).     cholecalciferol (VITAMIN D3) 25 MCG (  1000 UNIT) tablet Take 1,000 Units by mouth daily.     oxyCODONE (OXY IR/ROXICODONE) 5 MG immediate release tablet Take 1 tablet (5 mg total) by mouth every 4 (four) hours as needed (severe pain). 20 tablet 0   sertraline (ZOLOFT) 25 MG tablet Take 1 tablet (25 mg total) by mouth daily. 90 tablet 1   Spacer/Aero-Holding Chambers DEVI 1 Device by Does not apply route daily as needed. 1 Device 0   tadalafil (CIALIS) 5 MG tablet TAKE 1 TABLET BY MOUTH  DAILY 90 tablet 1   testosterone cypionate (DEPOTESTOSTERONE CYPIONATE) 200 MG/ML injection Inject 1 mL (200 mg total) into the muscle once a week. 12 mL 3   traZODone (DESYREL) 50 MG tablet Take 1-2 tablets (50-100 mg total) by mouth at bedtime as needed. 180 tablet 3   valsartan (DIOVAN) 40 MG tablet TAKE 1 TABLET BY MOUTH EVERY DAY 90 tablet 0   No current facility-administered medications for this visit.   No results found.  Review of Systems:   A ROS was performed including pertinent  positives and negatives as documented in the HPI.   Musculoskeletal Exam:    There were no vitals taken for this visit.  Left hip portal incisions are clean dry and intact.  There is no erythema or drainage.  Is able to flex and extend the left knee.  Sensation is intact in the entire left lower extremity 2+ dorsalis pedis pulse  Imaging:    None  I personally reviewed and interpreted the radiographs.   Assessment:   61 year old male 2 weeks status post left hip debridement arthroscopically overall he is doing extremely well.  At today's visit I would like him to progress his weightbearing to weightbearing as tolerated.  As he feels more comfortable weightbearing without crutches I would also like him to progress out of his brace.  I will plan to prescribe him ferrous sulfate in preparation for giving blood in 2 additional weeks  Plan :    -Return to clinic in 4 weeks      I personally saw and evaluated the patient, and participated in the management and treatment plan.  Vanetta Mulders, MD Attending Physician, Orthopedic Surgery  This document was dictated using Dragon voice recognition software. A reasonable attempt at proof reading has been made to minimize errors.

## 2021-07-10 ENCOUNTER — Other Ambulatory Visit: Payer: Self-pay

## 2021-07-10 ENCOUNTER — Ambulatory Visit (HOSPITAL_BASED_OUTPATIENT_CLINIC_OR_DEPARTMENT_OTHER): Payer: 59 | Admitting: Physical Therapy

## 2021-07-10 ENCOUNTER — Encounter (HOSPITAL_BASED_OUTPATIENT_CLINIC_OR_DEPARTMENT_OTHER): Payer: Self-pay | Admitting: Physical Therapy

## 2021-07-10 DIAGNOSIS — M25852 Other specified joint disorders, left hip: Secondary | ICD-10-CM | POA: Diagnosis not present

## 2021-07-10 DIAGNOSIS — M25652 Stiffness of left hip, not elsewhere classified: Secondary | ICD-10-CM

## 2021-07-10 DIAGNOSIS — R2689 Other abnormalities of gait and mobility: Secondary | ICD-10-CM

## 2021-07-10 DIAGNOSIS — M25552 Pain in left hip: Secondary | ICD-10-CM

## 2021-07-10 NOTE — Therapy (Signed)
OUTPATIENT PHYSICAL THERAPY TREATMENT NOTE   Patient Name: Max [name] MRN: [id number] DOB:14-Oct-1960, 61 y.o., male Today's Date: 07/10/2021  PCP: Caren Macadam, MD    PT End of Session - 07/10/21 0933     Visit Number 4    Number of Visits 24    Date for PT Re-Evaluation 09/14/21    PT Start Time 0930    PT Stop Time 1013    PT Time Calculation (min) 43 min    Activity Tolerance Patient tolerated treatment well    Behavior During Therapy [facility] for tasks assessed/performed              Past Medical History:  Diagnosis Date   Abnormal renal function    states labs were elevated due to taking ANSAIDS-he is treated by a urologist in [place] kidney disease    stage 2   Hamstring tear    left   Hypertension    Pre-diabetes    Testosterone deficiency    treated by [facility] MD-[place]   Past Surgical History:  Procedure Laterality Date   HIP ARTHROSCOPY Left 06/19/2021   Procedure: ARTHROSCOPY LEFT HIP WITH LABRAL REPAIR, CARM AND PINOR DEBRIDEMENT;  Surgeon: Vanetta Mulders, MD;  Location: [facility];  Service: Orthopedics;  Laterality: Left;   NO PAST SURGERIES     Patient Active Problem List   Diagnosis Date Noted   Hip impingement syndrome, left    Left hip pain 12/28/2020   Allergy-induced asthma 09/10/2019   Hypertension 09/03/2019   Low testosterone 09/03/2019    REFERRING PROVIDER: Vanetta Mulders, MD   REFERRING DIAG: 2237151685 (ICD-10-CM) - Hip impingement syndrome, left   THERAPY DIAG:  Left Hip Pain  Lift Hip Stiffness  Other Abnormalities of Gait and Mobility        ONSET DATE: 2/72022     SUBJECTIVE:    SUBJECTIVE STATEMENT: Feeling okay. Wearing brace.    PERTINENT HISTORY: Remote hamstring tear; CKD, Arthritis of the left hip; Pre-diabetes,    PAIN:  Are you having pain? No NPRS scale: 0/10 Pain location: Anterior left hip  Pain orientation: Left  PAIN TYPE: aching Pain description:  intermittent  Aggravating factors: Standing and walking/ siting for too long  Relieving factors: Rest/ medication    PRECAUTIONS: Other: follow hip arthroscopy protocol    WEIGHT BEARING RESTRICTIONS Yes  touchdown weightbearing on the left lower extremity in his brace per op note     OCCUPATION:  Drives a forklift    Recrational activity: Went to the gym      PLOF: Independent   PATIENT GOALS :    Get back to normal      OBJECTIVE:  LE AROM/PROM:   PROM Left 06/22/2021 LEFT 07/10/21  Hip flexion 80 80  Hip extension     Hip abduction 8 15  Hip adduction     Hip internal rotation 5    Hip external rotation 5    (Blank rows = not tested)      TODAY'S TREATMENT:    2/28: GAIT TRAINING: with crutches, heel strike & posture with PWB MANUAL: hip PROM Bridges, ball bw knees Prone HS curl Sidelying clam  PATIENT EDUCATION:  Education details: Instructed pt in [place] precautions per op note and also what MD said today.  Exercise form.  Reviewed HEP and gave pt a HEP handout.  Educated pt in correct form and appropriate frequency.  Answered Pt's questions.  Person  educated: Patient and Spouse Education method: Explanation, Demonstration, Tactile cues, Verbal cues, and Handouts Education comprehension: verbalized understanding, returned demonstration, verbal cues required, tactile cues required, and needs further education     HOME EXERCISE PROGRAM:  Access Code: [email or web address] URL: [email or web address]     ASSESSMENT:   CLINICAL IMPRESSION: 3 weeks post op today. Time to educate and correct gait pattern with crutches but pt did very well and denied increase in pain.     Objective impairments include Abnormal gait, decreased activity tolerance, decreased endurance, decreased knowledge of use of DME, difficulty walking, decreased ROM, decreased strength, increased muscle spasms, and pain. These impairments are limiting patient from cleaning, driving, meal  prep, occupation, yard work, and shopping. Personal factors including 1-2 comorbidities: arthritis   are also affecting patient's functional outcome. Patient will benefit from skilled PT to address above impairments and improve overall function.   REHAB POTENTIAL: Excellent   CLINICAL DECISION MAKING: Stable/uncomplicated   EVALUATION COMPLEXITY: Low     GOALS:   SHORT TERM GOALS:   STG Name Target Date Goal status  1 Patient will increase hip flexion to 90 degrees  Baseline:  07/13/2021 INITIAL  2 Patient will be independent with basic HEP  Baseline:  07/13/2021 INITIAL  3 Patient will begin weight bearing per Protocol Baseline: 07/13/2021 INITIAL  LONG TERM GOALS:    LTG Name Target Date Goal status  1 Patient will progress off crutches to full weight bearing as tolerated  Baseline: 08/17/2021 INITIAL  2 Patient will go up and down 8 steps with reciprocal gait  Baseline: 08/17/2021 INITIAL  3 Further functional goals will be established as protocol progresses  Baseline: 08/17/2021 INITIAL  PLAN: PT FREQUENCY: 2x/week   PT DURATION: 8 weeks   PLANNED INTERVENTIONS: Therapeutic exercises, Therapeutic activity, Neuro Muscular re-education, Balance training, Gait training, Patient/Family education, Joint mobilization, Stair training, Aquatic Therapy, Electrical stimulation, Cryotherapy, Moist heat, Ultrasound, and Manual therapy   PLAN FOR NEXT SESSION: Cont per Dr. Eddie Dibbles Hip labral repair protocol.  Cont with hip PROM and exercises per protocol.  Isa Hitz C. Jodelle Fausto PT, DPT 07/10/21 4:32 PM

## 2021-07-17 ENCOUNTER — Encounter (HOSPITAL_BASED_OUTPATIENT_CLINIC_OR_DEPARTMENT_OTHER): Payer: Self-pay | Admitting: Physical Therapy

## 2021-07-17 ENCOUNTER — Ambulatory Visit (HOSPITAL_BASED_OUTPATIENT_CLINIC_OR_DEPARTMENT_OTHER): Payer: 59 | Attending: Orthopaedic Surgery | Admitting: Physical Therapy

## 2021-07-17 ENCOUNTER — Other Ambulatory Visit: Payer: Self-pay

## 2021-07-17 DIAGNOSIS — M25552 Pain in left hip: Secondary | ICD-10-CM | POA: Diagnosis not present

## 2021-07-17 DIAGNOSIS — M25652 Stiffness of left hip, not elsewhere classified: Secondary | ICD-10-CM | POA: Insufficient documentation

## 2021-07-17 DIAGNOSIS — R2689 Other abnormalities of gait and mobility: Secondary | ICD-10-CM | POA: Insufficient documentation

## 2021-07-17 NOTE — Therapy (Signed)
?OUTPATIENT PHYSICAL THERAPY TREATMENT NOTE ? ? ?Patient Name: Max [name] ?MRN: [id number] ?DOB:[birth year], 61 y.o., male ?Today's Date: 07/17/2021 ? ?PCP: Caren Macadam, MD ? ? ? PT End of Session - 07/17/21 0931   ? ? Visit Number 5   ? Number of Visits 24   ? Date for PT Re-Evaluation 09/14/21   ? PT Start Time 0930   ? PT Stop Time 1014   ? PT Time Calculation (min) 44 min   ? Activity Tolerance Patient tolerated treatment well   ? Behavior During Therapy [facility] for tasks assessed/performed   ? ?  ?  ? ?  ? ? ? ? ?Past Medical History:  ?Diagnosis Date  ? Abnormal renal function   ? states labs were elevated due to taking ANSAIDS-he is treated by a urologist in [place]  ? Arthritis   ? 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 Hamstring tear   ? left  ? Hypertension   ? Pre-diabetes   ? Testosterone deficiency   ? treated by [facility] [place]  ? ?Past Surgical History:  ?Procedure Laterality Date  ? HIP ARTHROSCOPY Left 06/19/2021  ? Procedure: ARTHROSCOPY LEFT HIP WITH LABRAL REPAIR, CARM AND PINOR DEBRIDEMENT;  Surgeon: Vanetta Mulders, MD;  Location: [facility];  Service: Orthopedics;  Laterality: Left;  ? NO PAST SURGERIES    ? ?Patient Active Problem List  ? Diagnosis Date Noted  ? Hip impingement syndrome, left   ? Left hip pain 12/28/2020  ? Allergy-induced asthma 09/10/2019  ? Hypertension 09/03/2019  ? Low testosterone 09/03/2019  ? ? ?REFERRING PROVIDER: Vanetta Mulders, MD ?  ?REFERRING DIAG: M25.852 (ICD-10-CM) - Hip impingement syndrome, left ?  ?THERAPY DIAG:  Left Hip Pain  ?Lift Hip Stiffness  ?Other Abnormalities of Gait and Mobility  ?  ?  ?  ?ONSET DATE: 2/72022 ?  ?  ?SUBJECTIVE:  ?  ?SUBJECTIVE STATEMENT: ?Walking is feeling pretty good. I have been having some groin pain with hip flexion and trying to walk to bathroom without crutchs.  ?  ?PERTINENT HISTORY: ?Remote hamstring tear; CKD, Arthritis of the left hip; Pre-diabetes,  ?  ?PAIN:  ?Are you having pain? No ?NPRS scale:  0/10 ?Pain location: Anterior left hip  ?Pain orientation: Left  ?PAIN TYPE: aching ?Pain description: intermittent  ?Aggravating factors: Standing and walking/ siting for too long  ?Relieving factors: Rest/ medication  ?  ?PRECAUTIONS: Other: follow hip arthroscopy protocol  ?  ?WEIGHT BEARING RESTRICTIONS Yes  touchdown weightbearing on the left lower extremity in his brace per op note ?   ? 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  ?Recrational activity: Went to the gym  ?  ?  ?PLOF: Independent ?  ?PATIENT GOALS :  ?  ?Get back to normal  ?  ?  ?OBJECTIVE:  ?LE AROM/PROM: ?  ?PROM Left ?06/22/2021 LEFT ?07/10/21  ?Hip flexion 80 80  ?Hip extension     ?Hip abduction 8 15  ?Hip adduction     ?Hip internal rotation 5    ?Hip external rotation 5   ? (Blank rows = not tested) ? ?  ?  TODAY'S TREATMENT: ?   ?3/7: ?MANUAL: STM adductors, pectineus; LAD grade 4; passive hip flexion & abduction ?Standing glut set and weight distribution ?GAIT training: no AD or brace ?Standing hip flexor stretch ? ?2/28: ?GAIT TRAINING: with crutches, heel strike & posture with PWB ?MANUAL: hip PROM ?Bridges, ball bw knees ?Prone HS  curl ?Sidelying clam ? ?PATIENT EDUCATION:  ?Education details: exercise form/rationale ?Person educated: Patient and Spouse ?Education method: Explanation, Demonstration, Tactile cues, Verbal cues, and Handouts ?Education comprehension: verbalized understanding, returned demonstration, verbal cues required, tactile cues required, and needs further education ?  ?  ?HOME EXERCISE PROGRAM: ? Access Code: [email or web address] ?URL: [email or web address] ? ? ?  ?ASSESSMENT: ?  ?CLINICAL IMPRESSION: ?MD clearance to come out of brace. Able to demo gait that is not antalgic without AD- encouraged him to ween from crutches to avoid limping as a compensation and he did walk out without crutches today. He is nervous about return to work 3/23 and we will cont to monitor.  ? ? ? Objective impairments include Abnormal gait,  decreased activity tolerance, decreased endurance, decreased knowledge of use of DME, difficulty walking, decreased ROM, decreased strength, increased muscle spasms, and pain. These impairments are limiting patient from cleaning, driving, meal prep, occupation, yard work, and shopping. Personal factors including 1-2 comorbidities: arthritis   are also affecting patient's functional outcome. Patient will benefit from skilled PT to address above impairments and improve overall function. ?  ?REHAB POTENTIAL: Excellent ?  ?CLINICAL DECISION MAKING: Stable/uncomplicated ?  ?EVALUATION COMPLEXITY: Low ?  ?  ?GOALS: ?  ?SHORT TERM GOALS: ?  ?STG Name Target Date Goal status  ?1 Patient will increase hip flexion to 90 degrees  ?Baseline:  07/13/2021 INITIAL  ?2 Patient will be independent with basic HEP  ?Baseline:  07/13/2021 INITIAL  ?3 Patient will begin weight bearing per Protocol ?Baseline: 07/13/2021 INITIAL  ?LONG TERM GOALS:  ?  ?LTG Name Target Date Goal status  ?1 Patient will progress off crutches to full weight bearing as tolerated  ?Baseline: 08/17/2021 INITIAL  ?2 Patient will go up and down 8 steps with reciprocal gait  ?Baseline: 08/17/2021 INITIAL  ?3 Further functional goals will be established as protocol progresses  ?Baseline: 08/17/2021 INITIAL  ?PLAN: ?PT FREQUENCY: 2x/week ?  ?PT DURATION: 8 weeks ?  ?PLANNED INTERVENTIONS: Therapeutic exercises, Therapeutic activity, Neuro Muscular re-education, Balance training, Gait training, Patient/Family education, Joint mobilization, Stair training, Aquatic Therapy, Electrical stimulation, Cryotherapy, Moist heat, Ultrasound, and Manual therapy ?  ?PLAN FOR NEXT SESSION: hip flexion stretching, gait with encouraged hip extension ? ?Matthew Cina C. Kasen Sako PT, DPT ?07/17/21 7:34 PM ? ? ? ?  ? ?

## 2021-07-24 ENCOUNTER — Other Ambulatory Visit: Payer: Self-pay

## 2021-07-24 ENCOUNTER — Encounter (HOSPITAL_BASED_OUTPATIENT_CLINIC_OR_DEPARTMENT_OTHER): Payer: Self-pay | Admitting: Physical Therapy

## 2021-07-24 ENCOUNTER — Ambulatory Visit (HOSPITAL_BASED_OUTPATIENT_CLINIC_OR_DEPARTMENT_OTHER): Payer: 59 | Admitting: Physical Therapy

## 2021-07-24 DIAGNOSIS — M25552 Pain in left hip: Secondary | ICD-10-CM | POA: Diagnosis not present

## 2021-07-24 DIAGNOSIS — M25652 Stiffness of left hip, not elsewhere classified: Secondary | ICD-10-CM

## 2021-07-24 DIAGNOSIS — R2689 Other abnormalities of gait and mobility: Secondary | ICD-10-CM

## 2021-07-24 NOTE — Therapy (Signed)
?OUTPATIENT PHYSICAL THERAPY TREATMENT NOTE ? ? ?Patient Name: Max [name] ?MRN: [id number] ?DOB:Jan 18, 1961, 61 y.o., male ?Today's Date: 07/24/2021 ? ?PCP: Caren Macadam, MD ? ? ? PT End of Session - 07/24/21 1021   ? ? Visit Number 6   ? Number of Visits 24   ? Date for PT Re-Evaluation 09/14/21   ? PT Start Time 1016   ? PT Stop Time 1056   ? PT Time Calculation (min) 40 min   ? Activity Tolerance Patient tolerated treatment well   ? Behavior During Therapy [facility] for tasks assessed/performed   ? ?  ?  ? ?  ? ? ? ? ?Past Medical History:  ?Diagnosis Date  ? Abnormal renal function   ? states labs were elevated due to taking ANSAIDS-he is treated by a urologist in [place]  ? Arthritis   ? 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 Hamstring tear   ? left  ? Hypertension   ? Pre-diabetes   ? Testosterone deficiency   ? treated by [facility] [place]  ? ?Past Surgical History:  ?Procedure Laterality Date  ? HIP ARTHROSCOPY Left 06/19/2021  ? Procedure: ARTHROSCOPY LEFT HIP WITH LABRAL REPAIR, CARM AND PINOR DEBRIDEMENT;  Surgeon: Vanetta Mulders, MD;  Location: [facility];  Service: Orthopedics;  Laterality: Left;  ? NO PAST SURGERIES    ? ?Patient Active Problem List  ? Diagnosis Date Noted  ? Hip impingement syndrome, left   ? Left hip pain 12/28/2020  ? Allergy-induced asthma 09/10/2019  ? Hypertension 09/03/2019  ? Low testosterone 09/03/2019  ? ? ?REFERRING PROVIDER: Vanetta Mulders, MD ?  ?REFERRING DIAG: M25.852 (ICD-10-CM) - Hip impingement syndrome, left ?  ?THERAPY DIAG:  Left Hip Pain  ?Lift Hip Stiffness  ?Other Abnormalities of Gait and Mobility  ?  ?  ?  ?ONSET DATE: 2/72022 ?  ?  ?SUBJECTIVE:  ?  ?SUBJECTIVE STATEMENT: ?I was sleeping in hooklying and my leg fell out to the side which hurt. Walked through grocery store without crutches and started limping. It is throbbing and sore ?  ?PERTINENT HISTORY: ?Remote hamstring tear; CKD, Arthritis of the left hip; Pre-diabetes,  ?  ?PAIN:  ?Are you  having pain? No ?NPRS scale: 7/10 ?Pain location: Anterior left hip  ?Pain orientation: Left  ?PAIN TYPE: aching ?Pain description: intermittent  ?Aggravating factors: Standing and walking/ siting for too long  ?Relieving factors: Rest/ medication  ?  ?PRECAUTIONS: Other: follow hip arthroscopy protocol  ?  ?WEIGHT BEARING RESTRICTIONS Yes  touchdown weightbearing on the left lower extremity in his brace per op note ?   ? 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  ?Recrational activity: Went to the gym  ?  ?  ?PLOF: Independent ?  ?PATIENT GOALS :  ?  ?Get back to normal  ?  ?  ?OBJECTIVE:  ?LE AROM/PROM: ?  ?PROM Left ?06/22/2021 LEFT ?07/10/21  ?Hip flexion 80 80  ?Hip extension     ?Hip abduction 8 15  ?Hip adduction     ?Hip internal rotation 5    ?Hip external rotation 5   ? (Blank rows = not tested) ? ?  ?  TODAY'S TREATMENT: ?   ?3/14: ?MANUAL:  ?Passive hip flexion, ER, IR, abduction ? LAD grade 4 ? Roller quads, STM to hip flexors ?Hooklying hip ER ?Mod thomas stretch ?Modified figure 4 ?Seated HSS ?Standing hip abd back diagonal with leg turned out ?Standing gastroc stretch ?Weight shift  to SLS with short hold ?Gait training: hip abd activation in stance phase ? ?3/7: ?MANUAL: STM adductors, pectineus; LAD grade 4; passive hip flexion & abduction ?Standing glut set and weight distribution ?GAIT training: no AD or brace ?Standing hip flexor stretch ? ? ?PATIENT EDUCATION:  ?Education details: exercise form/rationale ?Person educated: Patient and Spouse ?Education method: Explanation, Demonstration, Tactile cues, Verbal cues, and Handouts ?Education comprehension: verbalized understanding, returned demonstration, verbal cues required, tactile cues required, and needs further education ?  ?  ?HOME EXERCISE PROGRAM: ? Access Code: [email or web address] ?URL: [email or web address] ? ? ?  ?ASSESSMENT: ?  ?CLINICAL IMPRESSION: ? Provided with roller for self STM at home. Focused on progression of activation of hip abd  group. Able to eliminate antalgic gait and groin pain with proper postures. Encouraged him to work on endurance with activation.  ? ? ? Objective impairments include Abnormal gait, decreased activity tolerance, decreased endurance, decreased knowledge of use of DME, difficulty walking, decreased ROM, decreased strength, increased muscle spasms, and pain. These impairments are limiting patient from cleaning, driving, meal prep, occupation, yard work, and shopping. Personal factors including 1-2 comorbidities: arthritis   are also affecting patient's functional outcome. Patient will benefit from skilled PT to address above impairments and improve overall function. ?  ?REHAB POTENTIAL: Excellent ?  ?CLINICAL DECISION MAKING: Stable/uncomplicated ?  ?EVALUATION COMPLEXITY: Low ?  ?  ?GOALS: ?  ?SHORT TERM GOALS: ?  ?STG Name Target Date Goal status  ?1 Patient will increase hip flexion to 90 degrees  ?Baseline:  07/13/2021 achieved  ?2 Patient will be independent with basic HEP  ?Baseline:  07/13/2021 achieved  ?3 Patient will begin weight bearing per Protocol ?Baseline: 07/13/2021 achieved  ?LONG TERM GOALS:  ?  ?LTG Name Target Date Goal status  ?1 Patient will progress off crutches to full weight bearing as tolerated  ?Baseline: 08/17/2021 INITIAL  ?2 Patient will go up and down 8 steps with reciprocal gait  ?Baseline: 08/17/2021 INITIAL  ?3 Further functional goals will be established as protocol progresses  ?Baseline: 08/17/2021 INITIAL  ?PLAN: ?PT FREQUENCY: 2x/week ?  ?PT DURATION: 8 weeks ?  ?PLANNED INTERVENTIONS: Therapeutic exercises, Therapeutic activity, Neuro Muscular re-education, Balance training, Gait training, Patient/Family education, Joint mobilization, Stair training, Aquatic Therapy, Electrical stimulation, Cryotherapy, Moist heat, Ultrasound, and Manual therapy ?  ?PLAN FOR NEXT SESSION: stairs, cont abd group activaiton ? ?Eda Magnussen C. Arcenia Scarbro PT, DPT ?07/24/21 10:58 AM ? ? ? ?  ? ?

## 2021-08-01 ENCOUNTER — Other Ambulatory Visit: Payer: Self-pay

## 2021-08-01 ENCOUNTER — Ambulatory Visit (INDEPENDENT_AMBULATORY_CARE_PROVIDER_SITE_OTHER): Payer: 59 | Admitting: Orthopaedic Surgery

## 2021-08-01 ENCOUNTER — Encounter (HOSPITAL_BASED_OUTPATIENT_CLINIC_OR_DEPARTMENT_OTHER): Payer: Self-pay | Admitting: Physical Therapy

## 2021-08-01 ENCOUNTER — Ambulatory Visit (HOSPITAL_BASED_OUTPATIENT_CLINIC_OR_DEPARTMENT_OTHER): Payer: 59 | Admitting: Physical Therapy

## 2021-08-01 DIAGNOSIS — M25552 Pain in left hip: Secondary | ICD-10-CM

## 2021-08-01 DIAGNOSIS — R2689 Other abnormalities of gait and mobility: Secondary | ICD-10-CM

## 2021-08-01 DIAGNOSIS — M25852 Other specified joint disorders, left hip: Secondary | ICD-10-CM

## 2021-08-01 DIAGNOSIS — M25652 Stiffness of left hip, not elsewhere classified: Secondary | ICD-10-CM

## 2021-08-01 NOTE — Therapy (Signed)
?OUTPATIENT PHYSICAL THERAPY TREATMENT NOTE ? ? ?Patient Name: Max [name] ?MRN: [id number] ?DOB:1961-05-08, 61 y.o., male ?Today's Date: 08/02/2021 ? ?PCP: Caren Macadam, MD ? ? ? PT End of Session - 08/01/21 2137   ? ? Visit Number 7   ? Number of Visits 24   ? Date for PT Re-Evaluation 09/14/21   ? PT Start Time 0930   ? PT Stop Time 1012   ? PT Time Calculation (min) 42 min   ? Activity Tolerance Patient tolerated treatment well   ? Behavior During Therapy [facility] for tasks assessed/performed   ? ?  ?  ? ?  ? ? ? ? ? ?Past Medical History:  ?Diagnosis Date  ? Abnormal renal function   ? states labs were elevated due to taking ANSAIDS-he is treated by a urologist in [place]  ? Arthritis   ? 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 Hamstring tear   ? left  ? Hypertension   ? Pre-diabetes   ? Testosterone deficiency   ? treated by [facility] [place]  ? ?Past Surgical History:  ?Procedure Laterality Date  ? HIP ARTHROSCOPY Left 06/19/2021  ? Procedure: ARTHROSCOPY LEFT HIP WITH LABRAL REPAIR, CARM AND PINOR DEBRIDEMENT;  Surgeon: Vanetta Mulders, MD;  Location: [facility];  Service: Orthopedics;  Laterality: Left;  ? NO PAST SURGERIES    ? ?Patient Active Problem List  ? Diagnosis Date Noted  ? Hip impingement syndrome, left   ? Left hip pain 12/28/2020  ? Allergy-induced asthma 09/10/2019  ? Hypertension 09/03/2019  ? Low testosterone 09/03/2019  ? ? ?REFERRING PROVIDER: Vanetta Mulders, MD ?  ?REFERRING DIAG: M25.852 (ICD-10-CM) - Hip impingement syndrome, left ?  ?THERAPY DIAG:  Left Hip Pain  ?Lift Hip Stiffness  ?Other Abnormalities of Gait and Mobility  ?  ?  ?  ?ONSET DATE: 2/72022 ?  ?  ?SUBJECTIVE:  ?  ?SUBJECTIVE STATEMENT: ?Patient reports his pain has been better. He still has a noticeable antalgic gait. He reports her continues to have difficulty putting his shoe on.   ?PERTINENT HISTORY: ?Remote hamstring tear; CKD, Arthritis of the left hip; Pre-diabetes,  ?  ?PAIN:  ?Are you having pain? No  3/20 ?NPRS scale: 3/10 ?Pain location: Anterior left hip  ?Pain orientation: Left  ?PAIN TYPE: aching ?Pain description: intermittent  ?Aggravating factors: Standing and walking/ siting for too long  ?Relieving factors: Rest/ medication  ?  ?PRECAUTIONS: Other: follow hip arthroscopy protocol  ?  ?WEIGHT BEARING RESTRICTIONS Yes  touchdown weightbearing on the left lower extremity in his brace per op note ?   ? 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  ?Recrational activity: Went to the gym  ?  ?  ?PLOF: Independent ?  ?PATIENT GOALS :  ?  ?Get back to normal  ?  ?  ?OBJECTIVE:  ?LE AROM/PROM: ?  ?PROM Left ?06/22/2021 LEFT ?07/10/21  ?Hip flexion 80 80  ?Hip extension     ?Hip abduction 8 15  ?Hip adduction     ?Hip internal rotation 5    ?Hip external rotation 5   ? (Blank rows = not tested) ? ?  ?  TODAY'S TREATMENT: ?3/22 ?  Manual :  ?Passive hip flexion, ER, IR, abduction ? LAD grade 4 ?Bridge 2x10 ?Prone hip extension 2x10  ?Prone ER/IR 2x10  ?Side lying clam shell 2x10  ? ?Standing slow march with mod cuing for technique 2x10  ?Squats 2x10 ( 6 weeks of  protocol)  ?3/14: ?MANUAL:  ?Passive hip flexion, ER, IR, abduction ? LAD grade 4 ? Roller quads, STM to hip flexors ?Hooklying hip ER ?Mod thomas stretch ?Modified figure 4 ?Seated HSS ?Standing hip abd back diagonal with leg turned out ?Standing gastroc stretch ?Weight shift to SLS with short hold ?Gait training: hip abd activation in stance phase ? ?3/7: ?MANUAL: STM adductors, pectineus; LAD grade 4; passive hip flexion & abduction ?Standing glut set and weight distribution ?GAIT training: no AD or brace ?Standing hip flexor stretch ? ? ?PATIENT EDUCATION:  ?Education details: exercise form/rationale ?Person educated: Patient and Spouse ?Education method: Explanation, Demonstration, Tactile cues, Verbal cues, and Handouts ?Education comprehension: verbalized understanding, returned demonstration, verbal cues required, tactile cues required, and needs further  education ?  ?  ?HOME EXERCISE PROGRAM: ? Access Code: [email or web address] ?URL: [email or web address] ? ? ?  ?ASSESSMENT: ?  ?CLINICAL IMPRESSION: ? Patient remains tight in flexion and IR> Therapy focused on manual therapy today. He was given prone IR ER to work on to [employer] his active motion in those motions. He is at 6 weeks. Per protocol we can advance his standing exercises. He is cleared for squats and steps. He was advised of normal response to exercise. He continues to have gluteal weakness. He will see the MD today. We will continue to progress as tolerated.  ? ? Objective impairments include Abnormal gait, decreased activity tolerance, decreased endurance, decreased knowledge of use of DME, difficulty walking, decreased ROM, decreased strength, increased muscle spasms, and pain. These impairments are limiting patient from cleaning, driving, meal prep, occupation, yard work, and shopping. Personal factors including 1-2 comorbidities: arthritis   are also affecting patient's functional outcome. Patient will benefit from skilled PT to address above impairments and improve overall function. ?  ?REHAB POTENTIAL: Excellent ?  ?CLINICAL DECISION MAKING: Stable/uncomplicated ?  ?EVALUATION COMPLEXITY: Low ?  ?  ?GOALS: ?  ?SHORT TERM GOALS: ?  ?STG Name Target Date Goal status  ?1 Patient will increase hip flexion to 90 degrees  ?Baseline:  07/13/2021 achieved  ?2 Patient will be independent with basic HEP  ?Baseline:  07/13/2021 achieved  ?3 Patient will begin weight bearing per Protocol ?Baseline: 07/13/2021 achieved  ?LONG TERM GOALS:  ?  ?LTG Name Target Date Goal status  ?1 Patient will progress off crutches to full weight bearing as tolerated  ?Baseline: 08/17/2021 INITIAL  ?2 Patient will go up and down 8 steps with reciprocal gait  ?Baseline: 08/17/2021 INITIAL  ?3 Further functional goals will be established as protocol progresses  ?Baseline: 08/17/2021 INITIAL  ?PLAN: ?PT FREQUENCY: 2x/week ?  ?PT DURATION: 8  weeks ?  ?PLANNED INTERVENTIONS: Therapeutic exercises, Therapeutic activity, Neuro Muscular re-education, Balance training, Gait training, Patient/Family education, Joint mobilization, Stair training, Aquatic Therapy, Electrical stimulation, Cryotherapy, Moist heat, Ultrasound, and Manual therapy ?  ?PLAN FOR NEXT SESSION: stairs, cont abd group activaiton ? ?Carolyne Littles PT DPT  ?08/02/21 7:59 AM ? ? ? ?  ? ?

## 2021-08-01 NOTE — Progress Notes (Signed)
? ?                            ? ? ?Post Operative Evaluation ?  ? ?Procedure/Date of Surgery: left hip labral debridement with cam and pincer debridement June 19, 2021 ? ?Interval History:  ? ? ?Presents today for follow-up status post left hip arthroscopy.  Overall he is continues to make improvement.  He does state that he is approximately 90% recovered.  He does have some pain with figure-of-four position.  Mobility continues to be an issue.  He is working on strengthening of the left hip.  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 ?PMH/PSH/Family History/Social History/Meds/Allergies:   ? ?Past Medical History:  ?Diagnosis Date  ? Abnormal renal function   ? states labs were elevated due to taking ANSAIDS-he is treated by a urologist in [place]  ? Arthritis   ? 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 Hamstring tear   ? left  ? Hypertension   ? Pre-diabetes   ? Testosterone deficiency   ? treated by [facility] [place]  ? ?Past Surgical History:  ?Procedure Laterality Date  ? HIP ARTHROSCOPY Left 06/19/2021  ? Procedure: ARTHROSCOPY LEFT HIP WITH LABRAL REPAIR, CARM AND PINOR DEBRIDEMENT;  Surgeon: Vanetta Mulders, MD;  Location: [facility];  Service: Orthopedics;  Laterality: Left;  ? NO PAST SURGERIES    ? ?Social History  ? ?Socioeconomic History  ? Marital status: Single  ?  Spouse name: Not on file  ? Number of children: Not on file  ? Years of education: Not on file  ? Highest education level: Not on file  ?Occupational History  ? Not on file  ?Tobacco Use  ? Smoking status: Never  ? Smokeless tobacco: Never  ?Vaping Use  ? Vaping Use: Never used  ?Substance and Sexual Activity  ? Alcohol use: Not Currently  ?  Comment: quit at age 66  ? Drug use: Never  ? Sexual activity: Yes  ?Other Topics Concern  ? Not on file  ?Social History Narrative  ? Not on file  ? ?Social Determinants of Health  ? ?Financial Resource Strain: Not on file  ?Food Insecurity: Not on file   ?Transportation Needs: Not on file  ?Physical Activity: Not on file  ?Stress: Not on file  ?Social Connections: Not on file  ? ?Family History  ?Problem Relation Age of Onset  ? Hypertension Mother   ? Hypertension Father   ? Bowel Disease Father   ?     obstruction  ? Diabetes Sister   ? Hypertension Sister   ? Hypertension Sister   ? ?Allergies  ?Allergen Reactions  ? Ibuprofen   ?  Affects kidney function significantly  ? ?Current Outpatient Medications  ?Medication Sig Dispense Refill  ? acetaminophen (TYLENOL) 500 MG tablet Take 500 mg by mouth every 8 (eight) hours as needed for moderate pain.    ? amLODipine (NORVASC) 10 MG tablet Take 1 tablet (10 mg total) by mouth daily. 90 tablet 3  ? aspirin EC 325 MG tablet Take 1 tablet (325 mg total) by mouth daily. 30 tablet 0  ? Blood Pressure Monitoring (ADULT BLOOD PRESSURE CUFF LG) KIT 1 Device by Does not apply route daily as needed. 1 kit 0  ? carboxymethylcellulose (REFRESH PLUS) 0.5 % SOLN 1 drop 3 (three) times daily as needed (dry eyes).    ?  cholecalciferol (VITAMIN D3) 25 MCG (1000 UNIT) tablet Take 1,000 Units by mouth daily.    ? ferrous sulfate 325 (65 FE) MG tablet Take 1 tablet (325 mg total) by mouth daily with breakfast. 30 tablet 0  ? oxyCODONE (OXY IR/ROXICODONE) 5 MG immediate release tablet Take 1 tablet (5 mg total) by mouth every 4 (four) hours as needed (severe pain). 20 tablet 0  ? sertraline (ZOLOFT) 25 MG tablet Take 1 tablet (25 mg total) by mouth daily. 90 tablet 1  ? Spacer/Aero-Holding Chambers DEVI 1 Device by Does not apply route daily as needed. 1 Device 0  ? tadalafil (CIALIS) 5 MG tablet TAKE 1 TABLET BY MOUTH  DAILY 90 tablet 1  ? testosterone cypionate (DEPOTESTOSTERONE CYPIONATE) 200 MG/ML injection Inject 1 mL (200 mg total) into the muscle once a week. 12 mL 3  ? traZODone (DESYREL) 50 MG tablet Take 1-2 tablets (50-100 mg total) by mouth at bedtime as needed. 180 tablet 3  ? valsartan (DIOVAN) 40 MG tablet TAKE 1 TABLET BY  MOUTH EVERY DAY 90 tablet 0  ? ?No current facility-administered medications for this visit.  ? ?No results found. ? ?Review of Systems:   ?A ROS was performed including pertinent positives and negatives as documented in the HPI. ? ? ?Musculoskeletal Exam:   ? ?There were no vitals taken for this visit. ? ?Left hip portal incisions are clean dry and intact.  There is no erythema or drainage.  Weakness with resisted abduction of the left hip.  Internal rotation to approximately 20 degrees with minimal pain, there is some tenderness with 30 degrees of external rotation.  Remainder of sensory neuro exam is intact ?Imaging:   ? ?None ? ?I personally reviewed and interpreted the radiographs. ? ? ?Assessment:   ?61 year old male 6-week status post left hip arthroscopic debridement, overall doing very well.  I described that at 6 weeks is typically the period of a decrease in strength that I believe he is going through.  At this time I would like him to continue to work on strengthening and range of motion.  We will plan to hopefully see him back in approximately 3 weeks and possibly return him to work at around 4 weeks.  That being said I think he would benefit from additional range of motion and strengthening in the interim. ? ?Plan :   ? ?-Return to clinic in 4 weeks ? ? ? ? ? ?I personally saw and evaluated the patient, and participated in the management and treatment plan. ? ?Vanetta Mulders, MD ?Attending Physician, Orthopedic Surgery ? ?This document was dictated using Systems analyst. A reasonable attempt at proof reading has been made to minimize errors. ?

## 2021-08-08 ENCOUNTER — Other Ambulatory Visit: Payer: Self-pay

## 2021-08-08 ENCOUNTER — Ambulatory Visit (HOSPITAL_BASED_OUTPATIENT_CLINIC_OR_DEPARTMENT_OTHER): Payer: 59 | Admitting: Physical Therapy

## 2021-08-08 ENCOUNTER — Encounter (HOSPITAL_BASED_OUTPATIENT_CLINIC_OR_DEPARTMENT_OTHER): Payer: Self-pay | Admitting: Physical Therapy

## 2021-08-08 DIAGNOSIS — M25552 Pain in left hip: Secondary | ICD-10-CM | POA: Diagnosis not present

## 2021-08-08 DIAGNOSIS — M25652 Stiffness of left hip, not elsewhere classified: Secondary | ICD-10-CM

## 2021-08-08 DIAGNOSIS — R2689 Other abnormalities of gait and mobility: Secondary | ICD-10-CM

## 2021-08-08 NOTE — Therapy (Signed)
?OUTPATIENT PHYSICAL THERAPY TREATMENT NOTE ? ? ?Patient Name: Max [name] ?MRN: [id number] ?DOB:1961-01-06, 61 y.o., male ?Today's Date: 08/08/2021 ? ?PCP: Caren Macadam, MD ? ? ? PT End of Session - 08/08/21 0930   ? ? Visit Number 8   ? Number of Visits 24   ? Date for PT Re-Evaluation 09/14/21   ? PT Start Time [phone]   ? PT Stop Time 1010   ? PT Time Calculation (min) 43 min   ? Activity Tolerance Patient tolerated treatment well   ? Behavior During Therapy [facility] for tasks assessed/performed   ? ?  ?  ? ?  ? ? ? ? ? ?Past Medical History:  ?Diagnosis Date  ? Abnormal renal function   ? states labs were elevated due to taking ANSAIDS-he is treated by a urologist in [place]  ? Arthritis   ? 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 Hamstring tear   ? left  ? Hypertension   ? Pre-diabetes   ? Testosterone deficiency   ? treated by [facility] [place]  ? ?Past Surgical History:  ?Procedure Laterality Date  ? HIP ARTHROSCOPY Left 06/19/2021  ? Procedure: ARTHROSCOPY LEFT HIP WITH LABRAL REPAIR, CARM AND PINOR DEBRIDEMENT;  Surgeon: Vanetta Mulders, MD;  Location: [facility];  Service: Orthopedics;  Laterality: Left;  ? NO PAST SURGERIES    ? ?Patient Active Problem List  ? Diagnosis Date Noted  ? Hip impingement syndrome, left   ? Left hip pain 12/28/2020  ? Allergy-induced asthma 09/10/2019  ? Hypertension 09/03/2019  ? Low testosterone 09/03/2019  ? ? ?REFERRING PROVIDER: Vanetta Mulders, MD ?  ?REFERRING DIAG: M25.852 (ICD-10-CM) - Hip impingement syndrome, left ?  ?THERAPY DIAG:  Left Hip Pain  ?Lift Hip Stiffness  ?Other Abnormalities of Gait and Mobility  ?  ?  ?  ?ONSET DATE: 2/72022 ?  ?  ?SUBJECTIVE:  ?  ?SUBJECTIVE STATEMENT: ?The patient has been to the MD. He will be out of work until the 14th. He continues to have pain when his knee goes in or out from a hook lying position and when he puts his shoes on.  ? ?PERTINENT HISTORY: ?Remote hamstring tear; CKD, Arthritis of the left hip; Pre-diabetes,   ?  ?PAIN:  ?Are you having pain? No 3/29 ?NPRS scale: 3/10 ?Pain location: Anterior left hip  ?Pain orientation: Left  ?PAIN TYPE: aching ?Pain description: intermittent  ?Aggravating factors: Standing and walking/ siting for too long  ?Relieving factors: Rest/ medication  ?  ?PRECAUTIONS: Other: follow hip arthroscopy protocol  ?  ?WEIGHT BEARING RESTRICTIONS Yes  touchdown weightbearing on the left lower extremity in his brace per op note ?   ? 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  ?Recrational activity: Went to the gym  ?  ?  ?PLOF: Independent ?  ?PATIENT GOALS :  ?  ?Get back to normal  ?  ?  ?OBJECTIVE:  ?LE AROM/PROM: ?  ?PROM Left ?06/22/2021 LEFT ?07/10/21  ?Hip flexion 80 80  ?Hip extension     ?Hip abduction 8 15  ?Hip adduction     ?Hip internal rotation 5    ?Hip external rotation 5   ? (Blank rows = not tested) ? ?  ?  TODAY'S TREATMENT: ?3/29 ?Manual: side lying anterior hip stretch; grade III and IV inferior and posterior glides; Improved flexion ER and IR noted.  ? ?Bridge x20  ?Prone hip extension 2x10  ?Prone ER/IR x20  ?  Side lying clam shell 20  ? ?Standing slow march with mod cuing for technique 2x10  ?Squats 2x10 ( 6 weeks of protocol)  ?3/22 ?  ? Manual :  ?Passive hip flexion, ER, IR, abduction ? LAD grade 4 ?Bridge 2x10 ?Prone hip extension 2x10  ?Prone ER/IR 2x10  ?Side lying clam shell 2x10  ? ?Standing slow march with mod cuing for technique 2x10  ?Squats 2x10 ( 6 weeks of protocol)  ?3/14: ?MANUAL:  ?Passive hip flexion, ER, IR, abduction ? LAD grade 4 ? Roller quads, STM to hip flexors ?Hooklying hip ER ?Mod thomas stretch ?Modified figure 4 ?Seated HSS ?Standing hip abd back diagonal with leg turned out ?Standing gastroc stretch ?Weight shift to SLS with short hold ?Gait training: hip abd activation in stance phase ? ?PATIENT EDUCATION:  ?Education details: exercise form/rationale ?Person educated: Patient and Spouse ?Education method: Explanation, Demonstration, Tactile cues, Verbal  cues, and Handouts ?Education comprehension: verbalized understanding, returned demonstration, verbal cues required, tactile cues required, and needs further education ?  ?  ?HOME EXERCISE PROGRAM: ? Access Code: [email or web address] ?URL: [email or web address] ? ? ?  ?ASSESSMENT: ?  ?CLINICAL IMPRESSION: ? Patient continues to have anterior hip pain and tightness. He was shown the [employer] again. He will try to find an area in his house. He was encouraged a few times a day to work on that stretch. He was also shown where there is a large trigger point in his anterior hip. Therapy reviewed the exercises for him to work on. He may try 2x a week to come in to help him get back to work quicker. He tolerated the exercises given to him well. He was also shown a standing anterior hip stretch, but required more cuing for tehcnique.  ? Objective impairments include Abnormal gait, decreased activity tolerance, decreased endurance, decreased knowledge of use of DME, difficulty walking, decreased ROM, decreased strength, increased muscle spasms, and pain. These impairments are limiting patient from cleaning, driving, meal prep, occupation, yard work, and shopping. Personal factors including 1-2 comorbidities: arthritis   are also affecting patient's functional outcome. Patient will benefit from skilled PT to address above impairments and improve overall function. ?  ?REHAB POTENTIAL: Excellent ?  ?CLINICAL DECISION MAKING: Stable/uncomplicated ?  ?EVALUATION COMPLEXITY: Low ?  ?  ?GOALS: ?  ?SHORT TERM GOALS: ?  ?STG Name Target Date Goal status  ?1 Patient will increase hip flexion to 90 degrees  ?Baseline:  07/13/2021 achieved  ?2 Patient will be independent with basic HEP  ?Baseline:  07/13/2021 achieved  ?3 Patient will begin weight bearing per Protocol ?Baseline: 07/13/2021 achieved  ?LONG TERM GOALS:  ?  ?LTG Name Target Date Goal status  ?1 Patient will progress off crutches to full weight bearing as tolerated   ?Baseline: 08/17/2021 INITIAL  ?2 Patient will go up and down 8 steps with reciprocal gait  ?Baseline: 08/17/2021 INITIAL  ?3 Further functional goals will be established as protocol progresses  ?Baseline: 08/17/2021 INITIAL  ?PLAN: ?PT FREQUENCY: 2x/week ?  ?PT DURATION: 8 weeks ?  ?PLANNED INTERVENTIONS: Therapeutic exercises, Therapeutic activity, Neuro Muscular re-education, Balance training, Gait training, Patient/Family education, Joint mobilization, Stair training, Aquatic Therapy, Electrical stimulation, Cryotherapy, Moist heat, Ultrasound, and Manual therapy ?  ?PLAN FOR NEXT SESSION: stairs, cont abd group activaiton ? ?Carolyne Littles PT DPT  ?08/08/21 11:25 AM ? ? ? ?  ? ?

## 2021-08-15 ENCOUNTER — Ambulatory Visit (HOSPITAL_BASED_OUTPATIENT_CLINIC_OR_DEPARTMENT_OTHER): Payer: 59 | Attending: Orthopaedic Surgery | Admitting: Physical Therapy

## 2021-08-15 ENCOUNTER — Encounter (HOSPITAL_BASED_OUTPATIENT_CLINIC_OR_DEPARTMENT_OTHER): Payer: Self-pay | Admitting: Physical Therapy

## 2021-08-15 ENCOUNTER — Other Ambulatory Visit (HOSPITAL_BASED_OUTPATIENT_CLINIC_OR_DEPARTMENT_OTHER): Payer: Self-pay | Admitting: Orthopaedic Surgery

## 2021-08-15 DIAGNOSIS — M25852 Other specified joint disorders, left hip: Secondary | ICD-10-CM

## 2021-08-15 DIAGNOSIS — M25552 Pain in left hip: Secondary | ICD-10-CM | POA: Diagnosis present

## 2021-08-15 DIAGNOSIS — R2689 Other abnormalities of gait and mobility: Secondary | ICD-10-CM | POA: Diagnosis present

## 2021-08-15 DIAGNOSIS — M25652 Stiffness of left hip, not elsewhere classified: Secondary | ICD-10-CM | POA: Diagnosis present

## 2021-08-15 NOTE — Therapy (Signed)
?OUTPATIENT PHYSICAL THERAPY TREATMENT NOTE ? ? ?Patient Name: Max [name] ?MRN: [id number] ?DOB:[birth year], 61 y.o., male ?Today's Date: 08/16/2021 ? ?PCP: Caren Macadam, MD ? ? ? PT End of Session - 08/15/21 1047   ? ? Visit Number 9   ? Number of Visits 24   ? Date for PT Re-Evaluation 09/14/21   ? PT Start Time 1015   ? PT Stop Time [id number]   ? PT Time Calculation (min) 38 min   ? Activity Tolerance Patient tolerated treatment well   ? Behavior During Therapy [facility] for tasks assessed/performed   ? ?  ?  ? ?  ? ? ? ? ? ? ?Past Medical History:  ?Diagnosis Date  ? Abnormal renal function   ? states labs were elevated due to taking ANSAIDS-he is treated by a urologist in [place]  ? Arthritis   ? 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 Hamstring tear   ? left  ? Hypertension   ? Pre-diabetes   ? Testosterone deficiency   ? treated by [facility] MD-[place]  ? ?Past Surgical History:  ?Procedure Laterality Date  ? HIP ARTHROSCOPY Left 06/19/2021  ? Procedure: ARTHROSCOPY LEFT HIP WITH LABRAL REPAIR, CARM AND PINOR DEBRIDEMENT;  Surgeon: Vanetta Mulders, MD;  Location: [place];  Service: Orthopedics;  Laterality: Left;  ? NO PAST SURGERIES    ? ?Patient Active Problem List  ? Diagnosis Date Noted  ? Hip impingement syndrome, left   ? Left hip pain 12/28/2020  ? Allergy-induced asthma 09/10/2019  ? Hypertension 09/03/2019  ? Low testosterone 09/03/2019  ? ? ?REFERRING PROVIDER: Vanetta Mulders, MD ?  ?REFERRING DIAG: M25.852 (ICD-10-CM) - Hip impingement syndrome, left ?  ?THERAPY DIAG:  Left Hip Pain  ?Lift Hip Stiffness  ?Other Abnormalities of Gait and Mobility  ?  ?  ?  ?ONSET DATE: 2/72022 ?  ?  ?SUBJECTIVE:  ?  ?SUBJECTIVE STATEMENT: ?The patient reports th pain has been good. He is frustrated that he still can not pull his foot up on his leg. He was encouraged to practice frequently at home.  ?PERTINENT HISTORY: ?Remote hamstring tear; CKD, Arthritis of the left hip; Pre-diabetes,  ?  ?PAIN:  ?Are you  having pain? No 4/5 ?NPRS scale: 0/10 ?Pain location: Anterior left hip  ?Pain orientation: Left  ?PAIN TYPE: aching ?Pain description: intermittent  ?Aggravating factors: Standing and walking/ siting for too long  ?Relieving factors: Rest/ medication  ?  ?PRECAUTIONS: Other: follow hip arthroscopy protocol  ?  ?WEIGHT BEARING RESTRICTIONS Yes  touchdown weightbearing on the left lower extremity in his brace per op note ?   ? 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  ?Recrational activity: Went to the gym  ?  ?  ?PLOF: Independent ?  ?PATIENT GOALS :  ?  ?Get back to normal  ?  ?  ?OBJECTIVE:  ?LE AROM/PROM: ?  ?PROM Left ?06/22/2021 LEFT ?07/10/21  ?Hip flexion 80 80  ?Hip extension     ?Hip abduction 8 15  ?Hip adduction     ?Hip internal rotation 5    ?Hip external rotation 5   ? (Blank rows = not tested) ? ?  ?  TODAY'S TREATMENT: ?4/5 ?Manual: side lying anterior hip stretch; grade III and IV inferior and posterior glides; Improved flexion ER and IR noted.  ? ?Bridge x20  ?Prone hip extension 2x10  ?Prone ER/IR x20  ?Side lying SLR x20  ? ?Squat x20  ? ?  Step up x15  6 inch  ?Side step x15 4 inch  ? ?3/29 ?Manual: side lying anterior hip stretch; grade III and IV inferior and posterior glides; Improved flexion ER and IR noted.  ? ?Bridge x20  ?Prone hip extension 2x10  ?Prone ER/IR x20  ?Side lying clam shell 20  ? ?Standing slow march with mod cuing for technique 2x10  ?Squats 2x10 ( 6 weeks of protocol)  ?3/22 ?  ? Manual :  ?Passive hip flexion, ER, IR, abduction ? LAD grade 4 ?Bridge 2x10 ?Prone hip extension 2x10  ?Prone ER/IR 2x10  ?Side lying clam shell 2x10  ? ?Standing slow march with mod cuing for technique 2x10  ?Squats 2x10 ( 6 weeks of protocol)  ?3/14: ?MANUAL:  ?Passive hip flexion, ER, IR, abduction ? LAD grade 4 ? Roller quads, STM to hip flexors ?Hooklying hip ER ?Mod thomas stretch ?Modified figure 4 ?Seated HSS ?Standing hip abd back diagonal with leg turned out ?Standing gastroc stretch ?Weight  shift to SLS with short hold ?Gait training: hip abd activation in stance phase ? ?PATIENT EDUCATION:  ?Education details: exercise form/rationale ?Person educated: Patient and Spouse ?Education method: Explanation, Demonstration, Tactile cues, Verbal cues, and Handouts ?Education comprehension: verbalized understanding, returned demonstration, verbal cues required, tactile cues required, and needs further education ?  ?  ?HOME EXERCISE PROGRAM: ? Access Code: [email or web address] ?URL: [email or web address] ? ? ?  ?ASSESSMENT: ?  ?CLINICAL IMPRESSION: ? The patient has found a PT place close to his house. He would like to transfer up there so he dosen't have to drive as far. He tolerated exercises well today. He has the best range that he has had so far. He is still tight with ER. He was advised to work on this 2-3x a day at home. He has no pain. His strength appears to be improving. We reviewed step ups to work on at home. He will likely continue his PT closer to home. He was advised to call the clinic and let us know if he does transfer. He was given the protocol to give to his new therapy group.  ? ? ? Objective impairments include Abnormal gait, decreased activity tolerance, decreased endurance, decreased knowledge of use of DME, difficulty walking, decreased ROM, decreased strength, increased muscle spasms, and pain. These impairments are limiting patient from cleaning, driving, meal prep, occupation, yard work, and shopping. Personal factors including 1-2 comorbidities: arthritis   are also affecting patient's functional outcome. Patient will benefit from skilled PT to address above impairments and improve overall function. ?  ?REHAB POTENTIAL: Excellent ?  ?CLINICAL DECISION MAKING: Stable/uncomplicated ?  ?EVALUATION COMPLEXITY: Low ?  ?  ?GOALS: ?  ?SHORT TERM GOALS: ?  ?STG Name Target Date Goal status  ?1 Patient will increase hip flexion to 90 degrees  ?Baseline:  07/13/2021 achieved  ?2 Patient will be  independent with basic HEP  ?Baseline:  07/13/2021 achieved  ?3 Patient will begin weight bearing per Protocol ?Baseline: 07/13/2021 achieved  ?LONG TERM GOALS:  ?  ?LTG Name Target Date Goal status  ?1 Patient will progress off crutches to full weight bearing as tolerated  ?Baseline: 08/17/2021 INITIAL  ?2 Patient will go up and down 8 steps with reciprocal gait  ?Baseline: 08/17/2021 INITIAL  ?3 Further functional goals will be established as protocol progresses  ?Baseline: 08/17/2021 INITIAL  ?PLAN: ?PT FREQUENCY: 2x/week ?  ?PT DURATION: 8 weeks ?  ?PLANNED INTERVENTIONS: Therapeutic exercises, Therapeutic activity, Neuro Muscular re-education, Balance training, Gait  training, Patient/Family education, Joint mobilization, Stair training, Aquatic Therapy, Electrical stimulation, Cryotherapy, Moist heat, Ultrasound, and Manual therapy ?  ?PLAN FOR NEXT SESSION: stairs, cont abd group activaiton ? ?Carolyne Littles PT DPT  ?08/16/21 10:02 AM ? ? ? ?  ? ?

## 2021-08-16 ENCOUNTER — Encounter (HOSPITAL_BASED_OUTPATIENT_CLINIC_OR_DEPARTMENT_OTHER): Payer: Self-pay | Admitting: Physical Therapy

## 2021-08-23 ENCOUNTER — Encounter (HOSPITAL_BASED_OUTPATIENT_CLINIC_OR_DEPARTMENT_OTHER): Payer: Self-pay | Admitting: Physical Therapy

## 2021-08-23 ENCOUNTER — Ambulatory Visit (HOSPITAL_BASED_OUTPATIENT_CLINIC_OR_DEPARTMENT_OTHER): Payer: 59 | Admitting: Physical Therapy

## 2021-08-23 DIAGNOSIS — M25652 Stiffness of left hip, not elsewhere classified: Secondary | ICD-10-CM

## 2021-08-23 DIAGNOSIS — M25552 Pain in left hip: Secondary | ICD-10-CM

## 2021-08-23 DIAGNOSIS — R2689 Other abnormalities of gait and mobility: Secondary | ICD-10-CM

## 2021-08-23 NOTE — Therapy (Signed)
?OUTPATIENT PHYSICAL THERAPY TREATMENT NOTE ? ? ?Patient Name: [place] Luth ?MRN: [id number] ?DOB:04-13-1961, 61 y.o., male ?Today's Date: 08/24/2021 ? ?PCP: Caren Macadam, MD ? ? ? PT End of Session - 08/23/21 1609   ? ? Visit Number 10   ? Number of Visits 24   ? Date for PT Re-Evaluation 09/14/21   ? PT Start Time [id number]   ? PT Stop Time 1640   ? PT Time Calculation (min) 41 min   ? Activity Tolerance Patient tolerated treatment well   ? Behavior During Therapy [facility] for tasks assessed/performed   ? ?  ?  ? ?  ? ? ? ? ? ? ? ?Past Medical History:  ?Diagnosis Date  ? Abnormal renal function   ? states labs were elevated due to taking ANSAIDS-he is treated by a urologist in [place]  ? Arthritis   ? 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 Hamstring tear   ? left  ? Hypertension   ? Pre-diabetes   ? Testosterone deficiency   ? treated by [facility] [place]  ? ?Past Surgical History:  ?Procedure Laterality Date  ? HIP ARTHROSCOPY Left 06/19/2021  ? Procedure: ARTHROSCOPY LEFT HIP WITH LABRAL REPAIR, CARM AND PINOR DEBRIDEMENT;  Surgeon: Vanetta Mulders, MD;  Location: [place];  Service: Orthopedics;  Laterality: Left;  ? NO PAST SURGERIES    ? ?Patient Active Problem List  ? Diagnosis Date Noted  ? Hip impingement syndrome, left   ? Left hip pain 12/28/2020  ? Allergy-induced asthma 09/10/2019  ? Hypertension 09/03/2019  ? Low testosterone 09/03/2019  ? ? ?REFERRING PROVIDER: Vanetta Mulders, MD ?  ?REFERRING DIAG: M25.852 (ICD-10-CM) - Hip impingement syndrome, left ?  ?THERAPY DIAG:  Left Hip Pain  ?Lift Hip Stiffness  ?Other Abnormalities of Gait and Mobility  ?  ?  ?  ?ONSET DATE: 2/72022 ?  ?  ?SUBJECTIVE:  ?  ?SUBJECTIVE STATEMENT: ?Still has some pain when trying to don sock.  ?PERTINENT HISTORY: ?Remote hamstring tear; CKD, Arthritis of the left hip; Pre-diabetes,  ?  ?PAIN:  ?Are you having pain? No 4/5 ?NPRS scale: 0/10 ?Pain location: Anterior left hip  ?Pain orientation: Left  ?PAIN TYPE:  aching ?Pain description: intermittent  ?Aggravating factors: Standing and walking/ siting for too long  ?Relieving factors: Rest/ medication  ?  ?PRECAUTIONS: Other: follow hip arthroscopy protocol  ?  ?WEIGHT BEARING RESTRICTIONS Yes  touchdown weightbearing on the left lower extremity in his brace per op note ?   ? 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  ?Recrational activity: Went to the gym  ?  ?  ?PLOF: Independent ?  ?PATIENT GOALS :  ?  ?Get back to normal  ?  ?  ?OBJECTIVE:  ?LE AROM/PROM: ?  ?PROM Left ?06/22/2021 LEFT ?07/10/21  ?Hip flexion 80 80  ?Hip extension     ?Hip abduction 8 15  ?Hip adduction     ?Hip internal rotation 5    ?Hip external rotation 5   ? (Blank rows = not tested) ? ?  ?  TODAY'S TREATMENT: ?4/13: ?MANUAL:  ?SLS with trunk rotation ?Single step fwd stand with ipsilateral trunk rotaiton- ball bw hands ?Shuttle: sidelying Lt leg press 2*25; supine double leg press 3*25; single leg press 2*25 ?Seated HSS ?Standing gastroc stretch ?Side lunges ? ?4/5 ?Manual: side lying anterior hip stretch; grade III and IV inferior and posterior glides; Improved flexion ER and IR noted.  ? ?Bridge  x20  ?Prone hip extension 2x10  ?Prone ER/IR x20  ?Side lying SLR x20  ? ?Squat x20  ? ?Step up x15  6 inch  ?Side step x15 4 inch  ? ?3/29 ?Manual: side lying anterior hip stretch; grade III and IV inferior and posterior glides; Improved flexion ER and IR noted.  ? ?Bridge x20  ?Prone hip extension 2x10  ?Prone ER/IR x20  ?Side lying clam shell 20  ? ?Standing slow march with mod cuing for technique 2x10  ?Squats 2x10 ( 6 weeks of protocol)  ?3/22 ?  ? Manual :  ?Passive hip flexion, ER, IR, abduction ? LAD grade 4 ?Bridge 2x10 ?Prone hip extension 2x10  ?Prone ER/IR 2x10  ?Side lying clam shell 2x10  ? ?Standing slow march with mod cuing for technique 2x10  ?Squats 2x10 ( 6 weeks of protocol)  ?3/14: ?MANUAL:  ?Passive hip flexion, ER, IR, abduction ? LAD grade 4 ? Roller quads, STM to hip flexors ?Hooklying  hip ER ?Mod thomas stretch ?Modified figure 4 ?Seated HSS ?Standing hip abd back diagonal with leg turned out ?Standing gastroc stretch ?Weight shift to SLS with short hold ?Gait training: hip abd activation in stance phase ? ?PATIENT EDUCATION:  ?Education details: exercise form/rationale ?Person educated: Patient and Spouse ?Education method: Explanation, Demonstration, Tactile cues, Verbal cues, and Handouts ?Education comprehension: verbalized understanding, returned demonstration, verbal cues required, tactile cues required, and needs further education ?  ?  ?HOME EXERCISE PROGRAM: ? Access Code: [email or web address] ?URL: [email or web address] ? ? ?  ?ASSESSMENT: ?  ?CLINICAL IMPRESSION: ? Hip ER tolerance improved with manual therapy- discussed lowering supporting leg until flexibility increases. Overall doing very well and strength is improving. Needs to be further challenged on higher steps before returning to work.  ? ? ? Objective impairments include Abnormal gait, decreased activity tolerance, decreased endurance, decreased knowledge of use of DME, difficulty walking, decreased ROM, decreased strength, increased muscle spasms, and pain. These impairments are limiting patient from cleaning, driving, meal prep, occupation, yard work, and shopping. Personal factors including 1-2 comorbidities: arthritis   are also affecting patient's functional outcome. Patient will benefit from skilled PT to address above impairments and improve overall function. ?  ?REHAB POTENTIAL: Excellent ?  ?CLINICAL DECISION MAKING: Stable/uncomplicated ?  ?EVALUATION COMPLEXITY: Low ?  ?  ?GOALS: ?  ?SHORT TERM GOALS: ?  ?STG Name Target Date Goal status  ?1 Patient will increase hip flexion to 90 degrees  ?Baseline:  07/13/2021 achieved  ?2 Patient will be independent with basic HEP  ?Baseline:  07/13/2021 achieved  ?3 Patient will begin weight bearing per Protocol ?Baseline: 07/13/2021 achieved  ?LONG TERM GOALS:  ?  ?LTG Name Target  Date Goal status  ?1 Patient will progress off crutches to full weight bearing as tolerated  ?Baseline: 08/17/2021 INITIAL  ?2 Patient will go up and down 8 steps with reciprocal gait  ?Baseline: 08/17/2021 INITIAL  ?3 Further functional goals will be established as protocol progresses  ?Baseline: 08/17/2021 INITIAL  ?PLAN: ?PT FREQUENCY: 2x/week ?  ?PT DURATION: 8 weeks ?  ?PLANNED INTERVENTIONS: Therapeutic exercises, Therapeutic activity, Neuro Muscular re-education, Balance training, Gait training, Patient/Family education, Joint mobilization, Stair training, Aquatic Therapy, Electrical stimulation, Cryotherapy, Moist heat, Ultrasound, and Manual therapy ?  ?PLAN FOR NEXT SESSION: high steps to re-create work environment ? ?Mahek Schlesinger C. Carly Sabo PT, DPT ?08/24/21 1:56 PM ? ? ? ?  ? ?

## 2021-08-24 ENCOUNTER — Ambulatory Visit (INDEPENDENT_AMBULATORY_CARE_PROVIDER_SITE_OTHER): Payer: 59 | Admitting: Orthopaedic Surgery

## 2021-08-24 DIAGNOSIS — M25852 Other specified joint disorders, left hip: Secondary | ICD-10-CM

## 2021-08-24 NOTE — Progress Notes (Signed)
? ?                            ? ? ?Post Operative Evaluation ?  ? ?Procedure/Date of Surgery: left hip labral debridement with cam and pincer debridement June 19, 2021 ? ?Interval History:  ? ?Presents today for follow-up of the above procedure.  Overall he continues to make improvement.  He does have some weakness persistently although he is so far very happy with the quality of his hip.  He is nervous about getting back to work and using.  Flexibility required in order to do this. ? ? ?PMH/PSH/Family History/Social History/Meds/Allergies:   ? ?Past Medical History:  ?Diagnosis Date  ? Abnormal renal function   ? states labs were elevated due to taking ANSAIDS-he is treated by a urologist in [place]  ? Arthritis   ? 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 Hamstring tear   ? left  ? Hypertension   ? Pre-diabetes   ? Testosterone deficiency   ? treated by [facility] [place]  ? ?Past Surgical History:  ?Procedure Laterality Date  ? HIP ARTHROSCOPY Left 06/19/2021  ? Procedure: ARTHROSCOPY LEFT HIP WITH LABRAL REPAIR, CARM AND PINOR DEBRIDEMENT;  Surgeon: Vanetta Mulders, MD;  Location: [place];  Service: Orthopedics;  Laterality: Left;  ? NO PAST SURGERIES    ? ?Social History  ? ?Socioeconomic History  ? Marital status: Single  ?  Spouse name: Not on file  ? Number of children: Not on file  ? Years of education: Not on file  ? Highest education level: Not on file  ?Occupational History  ? Not on file  ?Tobacco Use  ? Smoking status: Never  ? Smokeless tobacco: Never  ?Vaping Use  ? Vaping Use: Never used  ?Substance and Sexual Activity  ? Alcohol use: Not Currently  ?  Comment: quit at age 44  ? Drug use: Never  ? Sexual activity: Yes  ?Other Topics Concern  ? Not on file  ?Social History Narrative  ? Not on file  ? ?Social Determinants of Health  ? ?Financial Resource Strain: Not on file  ?Food Insecurity: Not on file  ?Transportation Needs: Not on file  ?Physical Activity: Not on file  ?Stress: Not on  file  ?Social Connections: Not on file  ? ?Family History  ?Problem Relation Age of Onset  ? Hypertension Mother   ? Hypertension Father   ? Bowel Disease Father   ?     obstruction  ? Diabetes Sister   ? Hypertension Sister   ? Hypertension Sister   ? ?Allergies  ?Allergen Reactions  ? Ibuprofen   ?  Affects kidney function significantly  ? ?Current Outpatient Medications  ?Medication Sig Dispense Refill  ? acetaminophen (TYLENOL) 500 MG tablet Take 500 mg by mouth every 8 (eight) hours as needed for moderate pain.    ? amLODipine (NORVASC) 10 MG tablet Take 1 tablet (10 mg total) by mouth daily. 90 tablet 3  ? aspirin EC 325 MG tablet Take 1 tablet (325 mg total) by mouth daily. 30 tablet 0  ? Blood Pressure Monitoring (ADULT BLOOD PRESSURE CUFF LG) KIT 1 Device by Does not apply route daily as needed. 1 kit 0  ? carboxymethylcellulose (REFRESH PLUS) 0.5 % SOLN 1 drop 3 (three) times daily as needed (dry eyes).    ? cholecalciferol (VITAMIN D3) 25 MCG (1000 UNIT) tablet Take 1,000 Units by  mouth daily.    ? ferrous sulfate 325 (65 FE) MG tablet Take 1 tablet (325 mg total) by mouth daily with breakfast. 30 tablet 0  ? oxyCODONE (OXY IR/ROXICODONE) 5 MG immediate release tablet Take 1 tablet (5 mg total) by mouth every 4 (four) hours as needed (severe pain). 20 tablet 0  ? sertraline (ZOLOFT) 25 MG tablet Take 1 tablet (25 mg total) by mouth daily. 90 tablet 1  ? Spacer/Aero-Holding Chambers DEVI 1 Device by Does not apply route daily as needed. 1 Device 0  ? tadalafil (CIALIS) 5 MG tablet TAKE 1 TABLET BY MOUTH  DAILY 90 tablet 1  ? testosterone cypionate (DEPOTESTOSTERONE CYPIONATE) 200 MG/ML injection Inject 1 mL (200 mg total) into the muscle once a week. 12 mL 3  ? traZODone (DESYREL) 50 MG tablet Take 1-2 tablets (50-100 mg total) by mouth at bedtime as needed. 180 tablet 3  ? valsartan (DIOVAN) 40 MG tablet TAKE 1 TABLET BY MOUTH EVERY DAY 90 tablet 0  ? ?No current facility-administered medications for this  visit.  ? ?No results found. ? ?Review of Systems:   ?A ROS was performed including pertinent positives and negatives as documented in the HPI. ? ? ?Musculoskeletal Exam:   ? ?There were no vitals taken for this visit. ? ?Left hip portal incisions are clean dry and intact.  There is no erythema or drainage.  Weakness with resisted abduction of the left hip.  Range of motion is 30 degrees internal and 45 external without significant pain.  This is limited compared to contralateral side.  He has weakness with hip abduction compared to the contralateral side ? ?None ? ?I personally reviewed and interpreted the radiographs. ? ? ?Assessment:   ?61 year old male 8 weeks status post left hip arthroscopic debridement, overall doing very well.  To see him back in 6 weeks.  I do believe that working out to get his flexibility and strength back for an additional 2 weeks is a good idea and best interest.  Work note provided.  I will see him back ? ?Plan :   ? ?-Return to clinic in 6 weeks ? ? ? ? ? ?I personally saw and evaluated the patient, and participated in the management and treatment plan. ? ?Vanetta Mulders, MD ?Attending Physician, Orthopedic Surgery ? ?This document was dictated using Systems analyst. A reasonable attempt at proof reading has been made to minimize errors. ?

## 2021-08-27 ENCOUNTER — Other Ambulatory Visit: Payer: Self-pay | Admitting: Family Medicine

## 2021-08-27 ENCOUNTER — Ambulatory Visit (HOSPITAL_BASED_OUTPATIENT_CLINIC_OR_DEPARTMENT_OTHER): Payer: 59 | Admitting: Physical Therapy

## 2021-08-27 ENCOUNTER — Encounter (HOSPITAL_BASED_OUTPATIENT_CLINIC_OR_DEPARTMENT_OTHER): Payer: Self-pay | Admitting: Physical Therapy

## 2021-08-27 DIAGNOSIS — M25652 Stiffness of left hip, not elsewhere classified: Secondary | ICD-10-CM

## 2021-08-27 DIAGNOSIS — R2689 Other abnormalities of gait and mobility: Secondary | ICD-10-CM

## 2021-08-27 DIAGNOSIS — M25552 Pain in left hip: Secondary | ICD-10-CM | POA: Diagnosis not present

## 2021-08-27 NOTE — Therapy (Signed)
?OUTPATIENT PHYSICAL THERAPY TREATMENT NOTE ? ? ?Patient Name: Max [name] ?MRN: [id number] ?DOB:08-Sep-1960, 61 y.o., male ?Today's Date: 08/27/2021 ? ?PCP: Caren Macadam, MD ? ? ? PT End of Session - 08/27/21 1349   ? ? Visit Number 11   ? Number of Visits 24   ? Date for PT Re-Evaluation 09/14/21   ? PT Start Time 1345   ? PT Stop Time 1428   ? PT Time Calculation (min) 43 min   ? Activity Tolerance Patient tolerated treatment well   ? Behavior During Therapy [facility] for tasks assessed/performed   ? ?  ?  ? ?  ? ? ? ? ? ? ? ?Past Medical History:  ?Diagnosis Date  ? Abnormal renal function   ? states labs were elevated due to taking ANSAIDS-he is treated by a urologist in [place]  ? Arthritis   ? 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 Hamstring tear   ? left  ? Hypertension   ? Pre-diabetes   ? Testosterone deficiency   ? treated by [facility] [place],  ? ?Past Surgical History:  ?Procedure Laterality Date  ? HIP ARTHROSCOPY Left 06/19/2021  ? Procedure: ARTHROSCOPY LEFT HIP WITH LABRAL REPAIR, CARM AND PINOR DEBRIDEMENT;  Surgeon: Vanetta Mulders, MD;  Location: [place];  Service: Orthopedics;  Laterality: Left;  ? NO PAST SURGERIES    ? ?Patient Active Problem List  ? Diagnosis Date Noted  ? Hip impingement syndrome, left   ? Left hip pain 12/28/2020  ? Allergy-induced asthma 09/10/2019  ? Hypertension 09/03/2019  ? Low testosterone 09/03/2019  ? ? ?REFERRING PROVIDER: Vanetta Mulders, MD ?  ?REFERRING DIAG: M25.852 (ICD-10-CM) - Hip impingement syndrome, left ?  ?THERAPY DIAG:  Left Hip Pain  ?Lift Hip Stiffness  ?Other Abnormalities of Gait and Mobility  ?  ?  ?  ?ONSET DATE: 2/72022 ?  ?  ?SUBJECTIVE:  ?  ?SUBJECTIVE STATEMENT: ?Still has some pain when trying to don sock.  ?PERTINENT HISTORY: ?Remote hamstring tear; CKD, Arthritis of the left hip; Pre-diabetes,  ?  ?PAIN:  ?Are you having pain? No 4/5 ?NPRS scale: 0/10 ?Pain location: Anterior left hip  ?Pain orientation: Left  ?PAIN TYPE:  aching ?Pain description: intermittent  ?Aggravating factors: Standing and walking/ siting for too long  ?Relieving factors: Rest/ medication  ?  ?PRECAUTIONS: Other: follow hip arthroscopy protocol  ?  ?WEIGHT BEARING RESTRICTIONS Yes  touchdown weightbearing on the left lower extremity in his brace per op note ?   ? 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  ?Recrational activity: Went to the gym  ?  ?  ?PLOF: Independent ?  ?PATIENT GOALS :  ?  ?Get back to normal  ?  ?  ?OBJECTIVE:  ?LE AROM/PROM: ?  ?PROM Left ?06/22/2021 LEFT ?07/10/21  ?Hip flexion 80 80  ?Hip extension     ?Hip abduction 8 15  ?Hip adduction     ?Hip internal rotation 5    ?Hip external rotation 5   ? (Blank rows = not tested) ? ? ?MMT Right ?06/22/2021 Left ?06/22/2021  ?Hip flexion  33.5  25.0  ?Hip extension      ?Hip abduction  49.1 43.6  ?Hip adduction      ?Hip internal rotation      ?Hip external rotation      ?Knee flexion      ?Knee extension  52.3 54.3   ?Ankle dorsiflexion      ?  Ankle plantarflexion      ?Ankle inversion      ?Ankle eversion      ? (Blank rows = not tested) ? ?  ?  TODAY'S TREATMENT: ?4/17 ?Manual: ; grade III and IV inferior and posterior glides; Improved flexion ER and IR noted. Hip flexion stretch  ?Lateral band walk 2x10 green ?Monster walk 2x10 with cuing for technique  ?Squat 2x15 with green band. Second set done in front of the mirror because he was shifting away from the left  ? ?Goblet squat 20 lbs 2x20  ? ? ? ? ?4/13: ?MANUAL:  ?SLS with trunk rotation ?Single step fwd stand with ipsilateral trunk rotaiton- ball bw hands ?Shuttle: sidelying Lt leg press 2*25; supine double leg press 3*25; single leg press 2*25 ?Seated HSS ?Standing gastroc stretch ?Side lunges ? ?4/5 ?Manual: side lying anterior hip stretch; grade III and IV inferior and posterior glides; Improved flexion ER and IR noted.  ? ?Bridge x20  ?Prone hip extension 2x10  ?Prone ER/IR x20  ?Side lying SLR x20  ? ?Squat x20  ? ?Step up x15  6 inch  ?Side  step x15 4 inch  ? ?PATIENT EDUCATION:  ?Education details: exercise form/rationale ?Person educated: Patient and Spouse ?Education method: Explanation, Demonstration, Tactile cues, Verbal cues, and Handouts ?Education comprehension: verbalized understanding, returned demonstration, verbal cues required, tactile cues required, and needs further education ?  ?  ?HOME EXERCISE PROGRAM: ? Access Code: [email or web address] ?URL: [email or web address] ? ? ?  ?ASSESSMENT: ?  ?CLINICAL IMPRESSION: ?Per visual inspection, the patients motion is the best that it has been. Therapy tested his strength. It is still  limited slightly as would be expected at this point. He was given more aggressive strengthening exercises to work on at home. He was given squats with a band around his leg. He shifts to the right with the squat. We reviewed his HEP. He was advised to continue to strengthen aggressively at home over the next two week.  ? ? Objective impairments include Abnormal gait, decreased activity tolerance, decreased endurance, decreased knowledge of use of DME, difficulty walking, decreased ROM, decreased strength, increased muscle spasms, and pain. These impairments are limiting patient from cleaning, driving, meal prep, occupation, yard work, and shopping. Personal factors including 1-2 comorbidities: arthritis   are also affecting patient's functional outcome. Patient will benefit from skilled PT to address above impairments and improve overall function. ?  ?REHAB POTENTIAL: Excellent ?  ?CLINICAL DECISION MAKING: Stable/uncomplicated ?  ?EVALUATION COMPLEXITY: Low ?  ?  ?GOALS: ?  ?SHORT TERM GOALS: ?  ?STG Name Target Date Goal status  ?1 Patient will increase hip flexion to 90 degrees  ?Baseline:  07/13/2021 achieved  ?2 Patient will be independent with basic HEP  ?Baseline:  07/13/2021 achieved  ?3 Patient will begin weight bearing per Protocol ?Baseline: 07/13/2021 achieved  ?LONG TERM GOALS:  ?  ?LTG Name Target Date  Goal status  ?1 Patient will progress off crutches to full weight bearing as tolerated  ?Baseline: 08/17/2021 INITIAL  ?2 Patient will go up and down 8 steps with reciprocal gait  ?Baseline: 08/17/2021 INITIAL  ?3 Further functional goals will be established as protocol progresses  ?Baseline: 08/17/2021 INITIAL  ?PLAN: ?PT FREQUENCY: 2x/week ?  ?PT DURATION: 8 weeks ?  ?PLANNED INTERVENTIONS: Therapeutic exercises, Therapeutic activity, Neuro Muscular re-education, Balance training, Gait training, Patient/Family education, Joint mobilization, Stair training, Aquatic Therapy, Electrical stimulation, Cryotherapy, Moist heat, Ultrasound, and Manual therapy ?  ?PLAN FOR NEXT  SESSION: high steps to re-create work environment ? ?Carolyne Littles PT DPT  ?08/27/21 3:05 PM ? ? ? ?  ? ?

## 2021-08-28 ENCOUNTER — Ambulatory Visit (HOSPITAL_BASED_OUTPATIENT_CLINIC_OR_DEPARTMENT_OTHER): Payer: 59 | Admitting: Physical Therapy

## 2021-08-30 ENCOUNTER — Ambulatory Visit (HOSPITAL_BASED_OUTPATIENT_CLINIC_OR_DEPARTMENT_OTHER): Payer: 59 | Admitting: Physical Therapy

## 2021-08-30 DIAGNOSIS — M25552 Pain in left hip: Secondary | ICD-10-CM | POA: Diagnosis not present

## 2021-08-30 DIAGNOSIS — R2689 Other abnormalities of gait and mobility: Secondary | ICD-10-CM

## 2021-08-30 DIAGNOSIS — M25652 Stiffness of left hip, not elsewhere classified: Secondary | ICD-10-CM

## 2021-08-30 NOTE — Therapy (Signed)
?OUTPATIENT PHYSICAL THERAPY TREATMENT NOTE ? ? ?Patient Name: Max [name] ?MRN: [id number] ?DOB:December 17, 1960, 61 y.o., male ?Today's Date: 08/30/2021 ? ?PCP: Caren Macadam, MD ? ? ? PT End of Session - 08/30/21 1349   ? ? Visit Number 12   ? Number of Visits 24   ? Date for PT Re-Evaluation 09/14/21   ? PT Start Time 1230   ? PT Stop Time 1310   ? PT Time Calculation (min) 40 min   ? Activity Tolerance Patient tolerated treatment well   ? Behavior During Therapy [facility] for tasks assessed/performed   ? ?  ?  ? ?  ? ? ? ? ? ? ? ? ?Past Medical History:  ?Diagnosis Date  ? Abnormal renal function   ? states labs were elevated due to taking ANSAIDS-he is treated by a urologist in [place]  ? Arthritis   ? 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 Hamstring tear   ? left  ? Hypertension   ? Pre-diabetes   ? Testosterone deficiency   ? treated by [facility] [place]  ? ?Past Surgical History:  ?Procedure Laterality Date  ? HIP ARTHROSCOPY Left 06/19/2021  ? Procedure: ARTHROSCOPY LEFT HIP WITH LABRAL REPAIR, CARM AND PINOR DEBRIDEMENT;  Surgeon: Vanetta Mulders, MD;  Location: [place] [facility];  Service: Orthopedics;  Laterality: Left;  ? NO PAST SURGERIES    ? ?Patient Active Problem List  ? Diagnosis Date Noted  ? Hip impingement syndrome, left   ? Left hip pain 12/28/2020  ? Allergy-induced asthma 09/10/2019  ? Hypertension 09/03/2019  ? Low testosterone 09/03/2019  ? ? ?REFERRING PROVIDER: Vanetta Mulders, MD ?  ?REFERRING DIAG: M25.852 (ICD-10-CM) - Hip impingement syndrome, left ?  ?THERAPY DIAG:  Left Hip Pain  ?Lift Hip Stiffness  ?Other Abnormalities of Gait and Mobility  ?  ?  ?  ?ONSET DATE: 2/72022 ?  ?  ?SUBJECTIVE:  ?  ?SUBJECTIVE STATEMENT: ?Still has some pain when trying to don sock.  ?PERTINENT HISTORY: ?Remote hamstring tear; CKD, Arthritis of the left hip; Pre-diabetes,  ?  ?PAIN:  ?Are you having pain? No 4/5 ?NPRS scale: 0/10 ?Pain location: Anterior left hip  ?Pain orientation: Left  ?PAIN TYPE:  aching ?Pain description: intermittent  ?Aggravating factors: Standing and walking/ siting for too long  ?Relieving factors: Rest/ medication  ?  ?PRECAUTIONS: Other: follow hip arthroscopy protocol  ?  ?WEIGHT BEARING RESTRICTIONS Yes  touchdown weightbearing on the left lower extremity in his brace per op note ?   ? 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  ?Recrational activity: Went to the gym  ?  ?  ?PLOF: Independent ?  ?PATIENT GOALS :  ?  ?Get back to normal  ?  ?  ?OBJECTIVE:  ?LE AROM/PROM: ?  ?PROM Left ?06/22/2021 LEFT ?07/10/21  ?Hip flexion 80 80  ?Hip extension     ?Hip abduction 8 15  ?Hip adduction     ?Hip internal rotation 5    ?Hip external rotation 5   ? (Blank rows = not tested) ? ? ?MMT Right ?06/22/2021 Left ?06/22/2021  ?Hip flexion  33.5  25.0  ?Hip extension      ?Hip abduction  49.1 43.6  ?Hip adduction      ?Hip internal rotation      ?Hip external rotation      ?Knee flexion      ?Knee extension  52.3 54.3   ?Ankle dorsiflexion      ?  Ankle plantarflexion      ?Ankle inversion      ?Ankle eversion      ? (Blank rows = not tested) ? ?  ?  TODAY'S TREATMENT: ?4/20 ?Manual: ; grade III and IV inferior and posterior glides; Improved flexion ER and IR noted. Hip flexion stretch  ?Lateral band walk 2x10 blue ?Squat 2x15 with blue and. ? ?Band 3 way lunge 5x each direction blue in low range  ? ?Supine:  ?Bridge x30  blue  ?Hip abduction Blue x30  ? ?Step up x30 6 inch  ?Lateral step up x20 6 inch  ?Step down x20 4 inc  ? ? ? ?4/17 ?Manual: ; grade III and IV inferior and posterior glides; Improved flexion ER and IR noted. Hip flexion stretch  ?Lateral band walk 2x10 green ?Monster walk 2x10 with cuing for technique  ?Squat 2x15 with green band. Second set done in front of the mirror because he was shifting away from the left  ? ?Goblet squat 20 lbs 2x20  ? ? ? ? ?4/13: ?MANUAL:  ?SLS with trunk rotation ?Single step fwd stand with ipsilateral trunk rotaiton- ball bw hands ?Shuttle: sidelying Lt leg  press 2*25; supine double leg press 3*25; single leg press 2*25 ?Seated HSS ?Standing gastroc stretch ?Side lunges ? ?4/5 ?Manual: side lying anterior hip stretch; grade III and IV inferior and posterior glides; Improved flexion ER and IR noted.  ? ?Bridge x20  ?Prone hip extension 2x10  ?Prone ER/IR x20  ?Side lying SLR x20  ? ?Squat x20  ? ?Step up x15  6 inch  ?Side step x15 4 inch  ? ?PATIENT EDUCATION:  ?Education details: exercise form/rationale ?Person educated: Patient and Spouse ?Education method: Explanation, Demonstration, Tactile cues, Verbal cues, and Handouts ?Education comprehension: verbalized understanding, returned demonstration, verbal cues required, tactile cues required, and needs further education ?  ?  ?HOME EXERCISE PROGRAM: ? Access Code: [email or web address] ?URL: [email or web address] ? ? ?  ?ASSESSMENT: ?  ?CLINICAL IMPRESSION: ?The patient continues to make great progress. He has nearly full ROM. He did have some difficulty putting his foot up to get his shoe on. He was advised that that in itself is a stretch and to keep practicing. He was given a blue band for his exercises.Therapy reviewed steps with him. He had no pain. He has one more week before he goes back to work. He was advised to keep stretching and exercises.  ? ? ? Objective impairments include Abnormal gait, decreased activity tolerance, decreased endurance, decreased knowledge of use of DME, difficulty walking, decreased ROM, decreased strength, increased muscle spasms, and pain. These impairments are limiting patient from cleaning, driving, meal prep, occupation, yard work, and shopping. Personal factors including 1-2 comorbidities: arthritis   are also affecting patient's functional outcome. Patient will benefit from skilled PT to address above impairments and improve overall function. ?  ?REHAB POTENTIAL: Excellent ?  ?CLINICAL DECISION MAKING: Stable/uncomplicated ?  ?EVALUATION COMPLEXITY: Low ?  ?  ?GOALS: ?   ?SHORT TERM GOALS: ?  ?STG Name Target Date Goal status  ?1 Patient will increase hip flexion to 90 degrees  ?Baseline:  07/13/2021 achieved  ?2 Patient will be independent with basic HEP  ?Baseline:  07/13/2021 achieved  ?3 Patient will begin weight bearing per Protocol ?Baseline: 07/13/2021 achieved  ?LONG TERM GOALS:  ?  ?LTG Name Target Date Goal status  ?1 Patient will progress off crutches to full weight bearing as tolerated  ?Baseline: 08/17/2021 INITIAL  ?2  Patient will go up and down 8 steps with reciprocal gait  ?Baseline: 08/17/2021 INITIAL  ?3 Further functional goals will be established as protocol progresses  ?Baseline: 08/17/2021 INITIAL  ?PLAN: ?PT FREQUENCY: 2x/week ?  ?PT DURATION: 8 weeks ?  ?PLANNED INTERVENTIONS: Therapeutic exercises, Therapeutic activity, Neuro Muscular re-education, Balance training, Gait training, Patient/Family education, Joint mobilization, Stair training, Aquatic Therapy, Electrical stimulation, Cryotherapy, Moist heat, Ultrasound, and Manual therapy ?  ?PLAN FOR NEXT SESSION: high steps to re-create work environment ? ?Carolyne Littles PT DPT  ?08/30/21 1:50 PM ? ? ? ?  ? ?

## 2021-09-03 ENCOUNTER — Ambulatory Visit (HOSPITAL_BASED_OUTPATIENT_CLINIC_OR_DEPARTMENT_OTHER): Payer: 59 | Admitting: Physical Therapy

## 2021-09-03 ENCOUNTER — Encounter (HOSPITAL_BASED_OUTPATIENT_CLINIC_OR_DEPARTMENT_OTHER): Payer: Self-pay | Admitting: Physical Therapy

## 2021-09-03 DIAGNOSIS — R2689 Other abnormalities of gait and mobility: Secondary | ICD-10-CM

## 2021-09-03 DIAGNOSIS — M25552 Pain in left hip: Secondary | ICD-10-CM | POA: Diagnosis not present

## 2021-09-03 DIAGNOSIS — M25652 Stiffness of left hip, not elsewhere classified: Secondary | ICD-10-CM

## 2021-09-03 NOTE — Therapy (Signed)
?OUTPATIENT PHYSICAL THERAPY TREATMENT NOTE ? ? ?Patient Name: Max [name] ?MRN: [id number] ?DOB:[birth year], 61 y.o., male ?Today's Date: 09/04/2021 ? ?PCP: Caren Macadam, MD ? ? ? PT End of Session - 09/03/21 0846   ? ? Visit Number 13   ? Number of Visits 24   ? Date for PT Re-Evaluation 10/12/21   ? PT Start Time 0845   ? PT Stop Time [phone]   ? PT Time Calculation (min) 43 min   ? Activity Tolerance Patient tolerated treatment well   ? Behavior During Therapy [facility] for tasks assessed/performed   ? ?  ?  ? ?  ? ? ? ? ? ? ? ? ?Past Medical History:  ?Diagnosis Date  ? Abnormal renal function   ? states labs were elevated due to taking ANSAIDS-he is treated by a urologist in [place]  ? Arthritis   ? 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 Hamstring tear   ? left  ? Hypertension   ? Pre-diabetes   ? Testosterone deficiency   ? treated by [facility],  ? ?Past Surgical History:  ?Procedure Laterality Date  ? HIP ARTHROSCOPY Left 06/19/2021  ? Procedure: ARTHROSCOPY LEFT HIP WITH LABRAL REPAIR, CARM AND PINOR DEBRIDEMENT;  Surgeon: Vanetta Mulders, MD;  Location: [facility];  Service: Orthopedics;  Laterality: Left;  ? NO PAST SURGERIES    ? ?Patient Active Problem List  ? Diagnosis Date Noted  ? Hip impingement syndrome, left   ? Left hip pain 12/28/2020  ? Allergy-induced asthma 09/10/2019  ? Hypertension 09/03/2019  ? Low testosterone 09/03/2019  ? ? ?REFERRING PROVIDER: Vanetta Mulders, MD ?  ?REFERRING DIAG: M25.852 (ICD-10-CM) - Hip impingement syndrome, left ?  ?THERAPY DIAG:  Left Hip Pain  ?Lift Hip Stiffness  ?Other Abnormalities of Gait and Mobility  ?  ?  ?  ?ONSET DATE: 2/72022 ?  ?  ?SUBJECTIVE:  ?  ?SUBJECTIVE STATEMENT: ?Going back to work on Monday.  ?PERTINENT HISTORY: ?Remote hamstring tear; CKD, Arthritis of the left hip; Pre-diabetes,  ?  ?PAIN:  ?Are you having pain? No  ?NPRS scale: 0/10 ?Pain location: Anterior left hip  ?Pain orientation: Left  ?PAIN TYPE: aching ?Pain  description: intermittent  ?Aggravating factors: Standing and walking/ siting for too long  ?Relieving factors: Rest/ medication  ?  ?PRECAUTIONS: Other: follow hip arthroscopy protocol  ?  ?WEIGHT BEARING RESTRICTIONS Yes  touchdown weightbearing on the left lower extremity in his brace per op note ?   ? 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  ?Recrational activity: Went to the gym  ?  ?  ?PLOF: Independent ?  ?PATIENT GOALS :  ?  ?Get back to normal  ?  ?  ?OBJECTIVE:  ?LE AROM/PROM: ?  ?PROM Left ?06/22/2021 LEFT ?07/10/21  ?Hip flexion 80 80  ?Hip extension     ?Hip abduction 8 15  ?Hip adduction     ?Hip internal rotation 5    ?Hip external rotation 5   ? (Blank rows = not tested) ? ? ? ?MMT Right ?06/22/2021 Left ?06/22/2021 Right/Left ?4/24  ?Hip flexion  33.5  25.0 61/48.4  ?Hip extension       ?Hip abduction  49.1 43.6 57.5/43.2  ?Hip adduction       ?Hip internal rotation       ?Hip external rotation       ?Knee flexion       ?Knee extension  52.3 54.3    ?  Ankle dorsiflexion       ?Ankle plantarflexion       ?Ankle inversion       ?Ankle eversion       ? (Blank rows = not tested) ? ? ? ?  ?  TODAY'S TREATMENT: ?4/24 ?Hip ER contract relax on table with back support ?12" step up with slow down- on triceps dip cable column ?Rows ?Gait: hip ext ?Shuttle 1*25 qped hip ext press ?Knee extension machine for qped hip ext press with knee flexed ?Manual HSS, passive hip flexion with post mobs ? ?4/20 ?Manual: ; grade III and IV inferior and posterior glides; Improved flexion ER and IR noted. Hip flexion stretch  ?Lateral band walk 2x10 blue ?Squat 2x15 with blue and. ? ?Band 3 way lunge 5x each direction blue in low range  ? ?Supine:  ?Bridge x30  blue  ?Hip abduction Blue x30  ? ?Step up x30 6 inch  ?Lateral step up x20 6 inch  ?Step down x20 4 inc  ? ? ? ?4/17 ?Manual: ; grade III and IV inferior and posterior glides; Improved flexion ER and IR noted. Hip flexion stretch  ?Lateral band walk 2x10 green ?Monster walk 2x10  with cuing for technique  ?Squat 2x15 with green band. Second set done in front of the mirror because he was shifting away from the left  ? ?Goblet squat 20 lbs 2x20  ? ? ? ?PATIENT EDUCATION:  ?Education details: exercise form/rationale ?Person educated: Patient and Spouse ?Education method: Explanation, Demonstration, Tactile cues, Verbal cues, and Handouts ?Education comprehension: verbalized understanding, returned demonstration, verbal cues required, tactile cues required, and needs further education ?  ?  ?HOME EXERCISE PROGRAM: ? Access Code: [email or web address] ?URL: [email or web address] ? ? ?  ?ASSESSMENT: ?  ?CLINICAL IMPRESSION: ?Pt is doing very well and flexibility is improving. Still has not achieved 90% strength to opposite LE which will be necessary for future protection of hip with a physical job.  ? ? ? Objective impairments include Abnormal gait, decreased activity tolerance, decreased endurance, decreased knowledge of use of DME, difficulty walking, decreased ROM, decreased strength, increased muscle spasms, and pain. These impairments are limiting patient from cleaning, driving, meal prep, occupation, yard work, and shopping. Personal factors including 1-2 comorbidities: arthritis   are also affecting patient's functional outcome. Patient will benefit from skilled PT to address above impairments and improve overall function. ?  ?REHAB POTENTIAL: Excellent ?  ?CLINICAL DECISION MAKING: Stable/uncomplicated ?  ?EVALUATION COMPLEXITY: Low ?  ?  ?GOALS: ?  ?SHORT TERM GOALS: ?  ?STG Name Target Date Goal status  ?1 Patient will increase hip flexion to 90 degrees  ?Baseline:  07/13/2021 achieved  ?2 Patient will be independent with basic HEP  ?Baseline:  07/13/2021 achieved  ?3 Patient will begin weight bearing per Protocol ?Baseline: 07/13/2021 achieved  ?LONG TERM GOALS:  ?  ?LTG Name Target Date Goal status  ?1 Patient will progress off crutches to full weight bearing as tolerated  ?Baseline:  08/17/2021 achieved  ?2 Patient will go up and down 8 steps with reciprocal gait  ?Baseline: 08/17/2021 achieved  ?3 Further functional goals will be established as protocol progresses  ?Baseline: 08/17/2021 achieved  ?4 Pt will demo eccentric control on LE for safe step down from high surface ?Baseline: able to control through approx 60 deg of hip flexion in post step down 10/12/21 new  ?5 Pt will demo strength 90% of opposite LE ?Baseline: see chart 10/12/21 new  ?6 Pt will  don sock comfortably ?Baseline: improved flexibility but still with significant difficulty and discomfort in reaching 10/12/21 new  ?PLAN: ?PT FREQUENCY: 2x/week ?  ?PT DURATION: 8 weeks ?  ?PLANNED INTERVENTIONS: Therapeutic exercises, Therapeutic activity, Neuro Muscular re-education, Balance training, Gait training, Patient/Family education, Joint mobilization, Stair training, Aquatic Therapy, Electrical stimulation, Cryotherapy, Moist heat, Ultrasound, and Manual therapy ?  ?PLAN FOR NEXT SESSION: cont large range strength control for functional strength ? ?Izayah Miner C. Fedora Knisely PT, DPT ?09/04/21 9:57 AM ? ? ? ?  ? ?

## 2021-09-04 ENCOUNTER — Telehealth: Payer: Self-pay | Admitting: Orthopaedic Surgery

## 2021-09-04 ENCOUNTER — Ambulatory Visit (HOSPITAL_BASED_OUTPATIENT_CLINIC_OR_DEPARTMENT_OTHER): Payer: 59 | Admitting: Physical Therapy

## 2021-09-04 NOTE — Telephone Encounter (Signed)
Received medical records release form, $25.00 cash and fitness for duty form from patient/forwarding to [facility] today ?

## 2021-09-05 ENCOUNTER — Ambulatory Visit (HOSPITAL_BASED_OUTPATIENT_CLINIC_OR_DEPARTMENT_OTHER): Payer: 59 | Admitting: Physical Therapy

## 2021-09-05 ENCOUNTER — Encounter: Payer: Self-pay | Admitting: Family Medicine

## 2021-09-05 ENCOUNTER — Encounter (HOSPITAL_BASED_OUTPATIENT_CLINIC_OR_DEPARTMENT_OTHER): Payer: Self-pay | Admitting: Physical Therapy

## 2021-09-05 ENCOUNTER — Ambulatory Visit (INDEPENDENT_AMBULATORY_CARE_PROVIDER_SITE_OTHER): Payer: 59 | Admitting: Family Medicine

## 2021-09-05 VITALS — BP 110/78 | HR 62 | Temp 98.1°F | Ht 68.75 in | Wt 189.1 lb

## 2021-09-05 DIAGNOSIS — R7989 Other specified abnormal findings of blood chemistry: Secondary | ICD-10-CM | POA: Diagnosis not present

## 2021-09-05 DIAGNOSIS — I10 Essential (primary) hypertension: Secondary | ICD-10-CM

## 2021-09-05 DIAGNOSIS — M25552 Pain in left hip: Secondary | ICD-10-CM

## 2021-09-05 DIAGNOSIS — R2689 Other abnormalities of gait and mobility: Secondary | ICD-10-CM

## 2021-09-05 DIAGNOSIS — Z23 Encounter for immunization: Secondary | ICD-10-CM | POA: Diagnosis not present

## 2021-09-05 DIAGNOSIS — Z0000 Encounter for general adult medical examination without abnormal findings: Secondary | ICD-10-CM

## 2021-09-05 DIAGNOSIS — E611 Iron deficiency: Secondary | ICD-10-CM

## 2021-09-05 DIAGNOSIS — M25652 Stiffness of left hip, not elsewhere classified: Secondary | ICD-10-CM

## 2021-09-05 DIAGNOSIS — Z1283 Encounter for screening for malignant neoplasm of skin: Secondary | ICD-10-CM

## 2021-09-05 DIAGNOSIS — Z13220 Encounter for screening for lipoid disorders: Secondary | ICD-10-CM | POA: Diagnosis not present

## 2021-09-05 DIAGNOSIS — N182 Chronic kidney disease, stage 2 (mild): Secondary | ICD-10-CM

## 2021-09-05 LAB — CBC WITH DIFFERENTIAL/PLATELET
Basophils Absolute: 0 10*3/uL (ref 0.0–0.1)
Basophils Relative: 0.6 % (ref 0.0–3.0)
Eosinophils Absolute: 0.1 10*3/uL (ref 0.0–0.7)
Eosinophils Relative: 1.6 % (ref 0.0–5.0)
HCT: 47.6 % (ref 39.0–52.0)
Hemoglobin: 15.9 g/dL (ref 13.0–17.0)
Lymphocytes Relative: 32.1 % (ref 12.0–46.0)
Lymphs Abs: 2.1 10*3/uL (ref 0.7–4.0)
MCHC: 33.3 g/dL (ref 30.0–36.0)
MCV: 90.5 fl (ref 78.0–100.0)
Monocytes Absolute: 0.7 10*3/uL (ref 0.1–1.0)
Monocytes Relative: 11.2 % (ref 3.0–12.0)
Neutro Abs: 3.6 10*3/uL (ref 1.4–7.7)
Neutrophils Relative %: 54.5 % (ref 43.0–77.0)
Platelets: 278 10*3/uL (ref 150.0–400.0)
RBC: 5.26 Mil/uL (ref 4.22–5.81)
RDW: 15.4 % (ref 11.5–15.5)
WBC: 6.5 10*3/uL (ref 4.0–10.5)

## 2021-09-05 LAB — COMPREHENSIVE METABOLIC PANEL
ALT: 33 U/L (ref 0–53)
AST: 23 U/L (ref 0–37)
Albumin: 4.4 g/dL (ref 3.5–5.2)
Alkaline Phosphatase: 65 U/L (ref 39–117)
BUN: 13 mg/dL (ref 6–23)
CO2: 28 mEq/L (ref 19–32)
Calcium: 9.8 mg/dL (ref 8.4–10.5)
Chloride: 103 mEq/L (ref 96–112)
Creatinine, Ser: 1.14 mg/dL (ref 0.40–1.50)
GFR: 70.37 mL/min (ref >60.00)
Glucose, Bld: 89 mg/dL (ref 70–99)
Potassium: 4.1 mEq/L (ref 3.5–5.1)
Sodium: 141 mEq/L (ref 135–145)
Total Bilirubin: 0.4 mg/dL (ref 0.2–1.2)
Total Protein: 7 g/dL (ref 6.0–8.3)

## 2021-09-05 LAB — FERRITIN: Ferritin: 41 ng/mL (ref 22.0–322.0)

## 2021-09-05 LAB — LIPID PANEL
Cholesterol: 237 mg/dL — ABNORMAL HIGH (ref 0–200)
HDL: 58.5 mg/dL (ref >39.00)
LDL Cholesterol: 153 mg/dL — ABNORMAL HIGH (ref 0–99)
NonHDL: 178.19
Total CHOL/HDL Ratio: 4
Triglycerides: 124 mg/dL (ref 0.0–149.0)
VLDL: 24.8 mg/dL (ref 0.0–40.0)

## 2021-09-05 LAB — PSA: PSA: 1.54 ng/mL (ref 0.10–4.00)

## 2021-09-05 LAB — TESTOSTERONE: Testosterone: 839.64 ng/dL (ref 300.00–890.00)

## 2021-09-05 MED ORDER — TRAZODONE HCL 50 MG PO TABS: 50.0000 mg | ORAL_TABLET | Freq: Every evening | ORAL | 3 refills | Status: DC | PRN

## 2021-09-05 NOTE — Therapy (Signed)
?OUTPATIENT PHYSICAL THERAPY TREATMENT NOTE ? ? ?Patient Name: Max [name] ?MRN: [id number] ?DOB:28-Jan-1961, 61 y.o., male ?Today's Date: 09/05/2021 ? ?PCP: Caren Macadam, MD ? ? ? PT End of Session - 09/05/21 1105   ? ? Visit Number 14   ? Number of Visits 24   ? Date for PT Re-Evaluation 10/12/21   ? PT Start Time 1101   ? PT Stop Time 1130   ? PT Time Calculation (min) 29 min   ? Activity Tolerance Patient tolerated treatment well   ? Behavior During Therapy [facility] for tasks assessed/performed   ? ?  ?  ? ?  ? ? ? ? ? ? ? ? ?Past Medical History:  ?Diagnosis Date  ? Abnormal renal function   ? states labs were elevated due to taking ANSAIDS-he is treated by a urologist in [place]  ? Arthritis   ? 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 Hamstring tear   ? left  ? Hypertension   ? Pre-diabetes   ? Testosterone deficiency   ? treated by [facility]  ? ?Past Surgical History:  ?Procedure Laterality Date  ? HIP ARTHROSCOPY Left 06/19/2021  ? Procedure: ARTHROSCOPY LEFT HIP WITH LABRAL REPAIR, CARM AND PINOR DEBRIDEMENT;  Surgeon: Vanetta Mulders, MD;  Location: [place];  Service: Orthopedics;  Laterality: Left;  ? NO PAST SURGERIES    ? ?Patient Active Problem List  ? Diagnosis Date Noted  ? Hip impingement syndrome, left   ? Left hip pain 12/28/2020  ? Allergy-induced asthma 09/10/2019  ? Hypertension 09/03/2019  ? Low testosterone 09/03/2019  ? ? ?REFERRING PROVIDER: Vanetta Mulders, MD ?  ?REFERRING DIAG: M25.852 (ICD-10-CM) - Hip impingement syndrome, left ?  ?THERAPY DIAG:  Left Hip Pain  ?Lift Hip Stiffness  ?Other Abnormalities of Gait and Mobility  ?  ?  ?  ?ONSET DATE: 2/72022 ?  ?  ?SUBJECTIVE:  ?  ?SUBJECTIVE STATEMENT: ?A little discomfort after last visit. Am fasting today after a physical.  ? ?PERTINENT HISTORY: ?Remote hamstring tear; CKD, Arthritis of the left hip; Pre-diabetes,  ?  ?PAIN:  ?Are you having pain? No  ?NPRS scale: 0/10 ?Pain location: Anterior left hip  ?Pain  orientation: Left  ?PAIN TYPE: aching ?Pain description: intermittent  ?Aggravating factors: Standing and walking/ siting for too long  ?Relieving factors: Rest/ medication  ?  ?PRECAUTIONS: Other: follow hip arthroscopy protocol  ?  ?WEIGHT BEARING RESTRICTIONS Yes  touchdown weightbearing on the left lower extremity in his brace per op note ?   ? 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  ?Recrational activity: Went to the gym  ?  ?  ?PLOF: Independent ?  ?PATIENT GOALS :  ?  ?Get back to normal  ?  ?  ?OBJECTIVE:  ?LE AROM/PROM: ?  ?PROM Left ?06/22/2021 LEFT ?07/10/21 Left ?4/26  ?Hip flexion 80 80   ?Hip extension      ?Hip abduction 8 15   ?Hip adduction      ?Hip internal rotation 5     ?Hip external rotation 5  In prone: 26  ? (Blank rows = not tested) ? ? ? ?MMT Right ?06/22/2021 Left ?06/22/2021 Right/Left ?4/24  ?Hip flexion  33.5  25.0 61/48.4  ?Hip extension       ?Hip abduction  49.1 43.6 57.5/43.2  ?Hip adduction       ?Hip internal rotation       ?Hip external rotation       ?  Knee flexion       ?Knee extension  52.3 54.3    ?Ankle dorsiflexion       ?Ankle plantarflexion       ?Ankle inversion       ?Ankle eversion       ? (Blank rows = not tested) ? ? ? ?  ?  TODAY'S TREATMENT: ?4/26 ?MANUAL: STM to pectineus, illiopsoas & quads in lengthened position off side of table ?Supine hiip ER ?Sidelying clam ?Prone hip ext with knee flexed ?Prone hip ER AROM ?Child pose stretch ?Primal push up + clam ?Cable column hip ext & abd ? ?4/24 ?Hip ER contract relax on table with back support ?12" step up with slow down- on triceps dip cable column ?Rows ?Gait: hip ext ?Shuttle 1*25 qped hip ext press ?Knee extension machine for qped hip ext press with knee flexed ?Manual HSS, passive hip flexion with post mobs ? ?4/20 ?Manual: ; grade III and IV inferior and posterior glides; Improved flexion ER and IR noted. Hip flexion stretch  ?Lateral band walk 2x10 blue ?Squat 2x15 with blue and. ? ?Band 3 way lunge 5x each direction  blue in low range  ? ?Supine:  ?Bridge x30  blue  ?Hip abduction Blue x30  ? ?Step up x30 6 inch  ?Lateral step up x20 6 inch  ?Step down x20 4 inc  ? ? ? ?4/17 ?Manual: ; grade III and IV inferior and posterior glides; Improved flexion ER and IR noted. Hip flexion stretch  ?Lateral band walk 2x10 green ?Monster walk 2x10 with cuing for technique  ?Squat 2x15 with green band. Second set done in front of the mirror because he was shifting away from the left  ? ?Goblet squat 20 lbs 2x20  ? ? ? ?PATIENT EDUCATION:  ?Education details: exercise form/rationale ?Person educated: Patient and Spouse ?Education method: Explanation, Demonstration, Tactile cues, Verbal cues, and Handouts ?Education comprehension: verbalized understanding, returned demonstration, verbal cues required, tactile cues required, and needs further education ?  ?  ?HOME EXERCISE PROGRAM: ? Access Code: [email or web address] ?URL: [email or web address] ? ? ?  ?ASSESSMENT: ?  ?CLINICAL IMPRESSION: ?Ended early today due to fatigue from fasting. Tightness in pectineus made stretching more comfortable. Will be returning to work on Monday.   ? ? ? Objective impairments include Abnormal gait, decreased activity tolerance, decreased endurance, decreased knowledge of use of DME, difficulty walking, decreased ROM, decreased strength, increased muscle spasms, and pain. These impairments are limiting patient from cleaning, driving, meal prep, occupation, yard work, and shopping. Personal factors including 1-2 comorbidities: arthritis   are also affecting patient's functional outcome. Patient will benefit from skilled PT to address above impairments and improve overall function. ?  ?REHAB POTENTIAL: Excellent ?  ?CLINICAL DECISION MAKING: Stable/uncomplicated ?  ?EVALUATION COMPLEXITY: Low ?  ?  ?GOALS: ?  ?SHORT TERM GOALS: ?  ?STG Name Target Date Goal status  ?1 Patient will increase hip flexion to 90 degrees  ?Baseline:  07/13/2021 achieved  ?2 Patient will be  independent with basic HEP  ?Baseline:  07/13/2021 achieved  ?3 Patient will begin weight bearing per Protocol ?Baseline: 07/13/2021 achieved  ?LONG TERM GOALS:  ?  ?LTG Name Target Date Goal status  ?1 Patient will progress off crutches to full weight bearing as tolerated  ?Baseline: 08/17/2021 achieved  ?2 Patient will go up and down 8 steps with reciprocal gait  ?Baseline: 08/17/2021 achieved  ?3 Further functional goals will be established as protocol progresses  ?Baseline: 08/17/2021  achieved  ?4 Pt will demo eccentric control on LE for safe step down from high surface ?Baseline: able to control through approx 60 deg of hip flexion in post step down 10/12/21 new  ?5 Pt will demo strength 90% of opposite LE ?Baseline: see chart 10/12/21 new  ?6 Pt will don sock comfortably ?Baseline: improved flexibility but still with significant difficulty and discomfort in reaching 10/12/21 new  ?PLAN: ?PT FREQUENCY: 2x/week ?  ?PT DURATION: 8 weeks ?  ?PLANNED INTERVENTIONS: Therapeutic exercises, Therapeutic activity, Neuro Muscular re-education, Balance training, Gait training, Patient/Family education, Joint mobilization, Stair training, Aquatic Therapy, Electrical stimulation, Cryotherapy, Moist heat, Ultrasound, and Manual therapy ?  ?PLAN FOR NEXT SESSION: cont large range strength control for functional strength ? ?Laylanie Kruczek C. Helaine Yackel PT, DPT ?09/05/21 12:08 PM ? ? ? ?  ? ?

## 2021-09-05 NOTE — Progress Notes (Signed)
Max [name] ?DOB: [birth year] ?Encounter date: 09/05/2021 ? ?This is a 61 y.o. male who presents for complete physical  ? ?History of present illness/Additional concerns: ?No worries today.  ? ?Has been out from work for 3 months after left hip surgery. Feeling better, but still a little pain. Working on therapy.  ? ?Was taking sleeping pills and been out for awhile.  ? ?HTN: hasn't been checking bp at home but stayed on valsartan.  ? ?Mood has been doing well overall. Staying on zoloft 64m daily.  ? ?Doing well with tesotsterone. Taking weekly 2046mshots.  ? ?Follows with dentist 3 times/year, just ad eye exam.  ? ?Cologuard: negative 09/2019 ? ? ?Past Medical History:  ?Diagnosis Date  ? Abnormal renal function   ? states labs were elevated due to taking ANSAIDS-he is treated by a urologist in [place]? Arthritis   ? 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 Hamstring tear   ? left  ? Hypertension   ? Pre-diabetes   ? Testosterone deficiency   ? [facility]  ? ?Past Surgical History:  ?Procedure Laterality Date  ? HIP ARTHROSCOPY Left 06/19/2021  ? Procedure: ARTHROSCOPY LEFT HIP WITH LABRAL REPAIR, CARM AND PINOR DEBRIDEMENT;  Surgeon: BoVanetta MuldersMD;  [facility] Service: [facility]ics;  Laterality: Left;  ? NO PAST SURGERIES    ? ?Allergies  ?Allergen Reactions  ? Ibuprofen   ?  Affects kidney function significantly  ? ?Current Meds  ?Medication Sig  ? acetaminophen (TYLENOL) 500 MG tablet Take 500 mg by mouth every 8 (eight) hours as needed for moderate pain.  ? amLODipine (NORVASC) 10 MG tablet Take 1 tablet (10 mg total) by mouth daily.  ? aspirin EC 325 MG tablet Take 1 tablet (325 mg total) by mouth daily.  ? Blood Pressure Monitoring (ADULT BLOOD PRESSURE CUFF LG) KIT 1 Device by Does not apply route daily as needed.  ? carboxymethylcellulose (REFRESH PLUS) 0.5 % SOLN 1 drop 3 (three) times daily as needed (dry eyes).  ? cholecalciferol (VITAMIN D3) 25 MCG (1000 UNIT)  tablet Take 1,000 Units by mouth daily.  ? sertraline (ZOLOFT) 25 MG tablet Take 1 tablet (25 mg total) by mouth daily.  ? Spacer/Aero-Holding Chambers DEVI 1 Device by Does not apply route daily as needed.  ? tadalafil (CIALIS) 5 MG tablet TAKE 1 TABLET BY MOUTH DAILY  ? testosterone cypionate (DEPOTESTOSTERONE CYPIONATE) 200 MG/ML injection Inject 1 mL (200 mg total) into the muscle once a week.  ? traZODone (DESYREL) 50 MG tablet Take 1 tablet (50 mg total) by mouth at bedtime as needed for sleep.  ? valsartan (DIOVAN) 40 MG tablet TAKE 1 TABLET BY MOUTH EVERY DAY  ? [DISCONTINUED] traZODone (DESYREL) 50 MG tablet Take 1-2 tablets (50-100 mg total) by mouth at bedtime as needed.  ? ?Social History  ? ?Tobacco Use  ? Smoking status: Never  ? Smokeless tobacco: Never  ?Substance Use Topics  ? Alcohol use: Not Currently  ?  Comment: quit at age 61? ?Family History  ?Problem Relation Age of Onset  ? Hypertension Mother   ? Hypertension Father   ? Bowel Disease Father   ?     obstruction  ? Diabetes Sister   ? Hypertension Sister   ? Hypertension Sister   ? ? ? ?Review of Systems  ?Constitutional:  Negative for activity change, appetite change, chills, fatigue, fever and unexpected weight change.  ?  HENT:  Negative for congestion, ear pain, hearing loss, sinus pressure, sinus pain, sore throat and trouble swallowing.   ?Eyes:  Negative for pain and visual disturbance.  ?Respiratory:  Negative for cough, chest tightness, shortness of breath and wheezing.   ?Cardiovascular:  Negative for chest pain, palpitations and leg swelling.  ?Gastrointestinal:  Negative for abdominal distention, abdominal pain, blood in stool, constipation, diarrhea, nausea and vomiting.  ?Genitourinary:  Negative for decreased urine volume, difficulty urinating, dysuria, penile pain and testicular pain.  ?Musculoskeletal:  Negative for arthralgias, back pain and joint swelling.  ?Skin:  Negative for rash.  ?Neurological:  Negative for dizziness,  weakness, numbness and headaches.  ?Hematological:  Negative for adenopathy. Does not bruise/bleed easily.  ?Psychiatric/Behavioral:  Negative for agitation, sleep disturbance and suicidal ideas. The patient is not nervous/anxious.   ? ?CBC:  ?Lab Results  ?Component Value Date  ? WBC 5.9 06/13/2021  ? HGB 17.4 (H) 06/13/2021  ? HCT 52.8 (H) 06/13/2021  ? MCH 29.2 06/13/2021  ? MCHC 33.0 06/13/2021  ? RDW 14.3 06/13/2021  ? PLT 293 06/13/2021  ? ?CMP: ?Lab Results  ?Component Value Date  ? NA 140 06/13/2021  ? K 3.7 06/13/2021  ? CL 103 06/13/2021  ? CO2 27 06/13/2021  ? ANIONGAP 10 06/13/2021  ? GLUCOSE 106 (H) 06/13/2021  ? BUN 9 06/13/2021  ? CREATININE 1.32 (H) 06/13/2021  ? CREATININE 1.32 04/26/2020  ? CALCIUM 9.4 06/13/2021  ? PROT 6.4 12/15/2020  ? BILITOT 0.4 12/15/2020  ? ALKPHOS 58 12/15/2020  ? ALT 31 12/15/2020  ? AST 25 12/15/2020  ? ?LIPID: ?Lab Results  ?Component Value Date  ? CHOL 207 (H) 04/12/2020  ? TRIG 82 04/12/2020  ? HDL 58 04/12/2020  ? LDLCALC 131 (H) 04/12/2020  ? ? ?Objective: ? ?BP 110/78 (BP Location: Left Arm, Patient Position: Sitting, Cuff Size: Large)   Pulse 62   Temp 98.1 ?F (36.7 ?C) (Oral)   Ht 5' 8.75" (1.746 m)   Wt 189 lb 1.6 oz (85.8 kg)   SpO2 98%   BMI 28.13 kg/m?   Weight: 189 lb 1.6 oz (85.8 kg)  ? ?BP Readings from Last 3 Encounters:  ?09/05/21 110/78  ?06/19/21 121/76  ?02/06/21 122/86  ? ?Wt Readings from Last 3 Encounters:  ?09/05/21 189 lb 1.6 oz (85.8 kg)  ?05/09/21 188 lb 9.6 oz (85.5 kg)  ?02/28/21 189 lb (85.7 kg)  ? ? ?Physical Exam ?Constitutional:   ?   General: He is not in acute distress. ?   Appearance: He is well-developed.  ?HENT:  ?   Head: Normocephalic and atraumatic.  ?   Right Ear: External ear normal.  ?   Left Ear: External ear normal.  ?   Nose: Nose normal.  ?   Mouth/Throat:  ?   Pharynx: No oropharyngeal exudate.  ?Eyes:  ?   Conjunctiva/sclera: Conjunctivae normal.  ?   Pupils: Pupils are equal, round, and reactive to light.  ?Neck:  ?    Thyroid: No thyromegaly.  ?Cardiovascular:  ?   Rate and Rhythm: Normal rate and regular rhythm.  ?   Heart sounds: Normal heart sounds. No murmur heard. ?  No friction rub. No gallop.  ?Pulmonary:  ?   Effort: Pulmonary effort is normal. No respiratory distress.  ?   Breath sounds: Normal breath sounds. No stridor. No wheezing or rales.  ?Abdominal:  ?   General: Bowel sounds are normal.  ?   Palpations: Abdomen is  soft.  ?Musculoskeletal:     ?   General: Normal range of motion.  ?   Cervical back: Neck supple.  ?Skin: ?   General: Skin is warm and dry.  ?Neurological:  ?   Mental Status: He is alert and oriented to person, place, and time.  ?Psychiatric:     ?   Behavior: Behavior normal.     ?   Thought Content: Thought content normal.     ?   Judgment: Judgment normal.  ? ? ?Assessment/Plan: ?Health Maintenance Due  ?Topic Date Due  ? Zoster Vaccines- Shingrix (2 of 2) 12/10/2020  ? ?Health Maintenance reviewed - up to date. ? ? ?1. Preventative health care ?Continue with regular exercise.  ? ?2. Primary hypertension ?Well controlled. Continue with valsartan 73m daily.  ?- CBC with Differential/Platelet; Future ?- Comprehensive metabolic panel; Future ? ?3. Iron deficiency ?- Ferritin; Future ? ?4. Lipid screening ?- Lipid panel; Future ? ?5. Low testosterone ?- Testosterone; Future ?- PSA; Future ? ?6. Skin cancer screening ?- Ambulatory referral to Dermatology ? ?7. Ckd stage 2. Has seen nephrology. Secondary to nsaids. Kidney function has been stable.  ? ?Return in about 6 months (around 03/07/2022). ? ?JMicheline Rough MD ? ? ? ? ? ?

## 2021-09-05 NOTE — Addendum Note (Signed)
Addended by: Sallee Lange A on: 09/05/2021 11:11 AM ? ? Modules accepted: Orders ? ?

## 2021-09-06 ENCOUNTER — Telehealth: Payer: Self-pay | Admitting: Dermatology

## 2021-09-06 NOTE — Telephone Encounter (Signed)
Notes documented and referral routed back to referring office. 

## 2021-09-06 NOTE — Telephone Encounter (Signed)
Patient is calling for a referral appointment from Theodis Shove, M.D.  Patient does not want to wait until December 2023 for an appointment so would like referral sent back to Dr. Dorita Fray office. ?

## 2021-09-07 ENCOUNTER — Ambulatory Visit (HOSPITAL_BASED_OUTPATIENT_CLINIC_OR_DEPARTMENT_OTHER): Payer: 59 | Admitting: Physical Therapy

## 2021-09-11 MED ORDER — ROSUVASTATIN CALCIUM 5 MG PO TABS: 5.0000 mg | ORAL_TABLET | Freq: Every day | ORAL | 1 refills | Status: DC

## 2021-09-11 NOTE — Addendum Note (Signed)
Addended by: Johnella Moloney on: 09/11/2021 10:09 AM ? ? Modules accepted: Orders ? ?

## 2021-09-14 ENCOUNTER — Ambulatory Visit (HOSPITAL_BASED_OUTPATIENT_CLINIC_OR_DEPARTMENT_OTHER): Payer: 59 | Admitting: Physical Therapy

## 2021-09-21 ENCOUNTER — Ambulatory Visit (HOSPITAL_BASED_OUTPATIENT_CLINIC_OR_DEPARTMENT_OTHER): Payer: 59 | Attending: Orthopaedic Surgery | Admitting: Physical Therapy

## 2021-09-21 ENCOUNTER — Encounter (HOSPITAL_BASED_OUTPATIENT_CLINIC_OR_DEPARTMENT_OTHER): Payer: Self-pay | Admitting: Physical Therapy

## 2021-09-21 DIAGNOSIS — R2689 Other abnormalities of gait and mobility: Secondary | ICD-10-CM | POA: Diagnosis present

## 2021-09-21 DIAGNOSIS — M25652 Stiffness of left hip, not elsewhere classified: Secondary | ICD-10-CM | POA: Insufficient documentation

## 2021-09-21 DIAGNOSIS — M25552 Pain in left hip: Secondary | ICD-10-CM | POA: Diagnosis present

## 2021-09-21 NOTE — Therapy (Addendum)
OUTPATIENT PHYSICAL THERAPY TREATMENT NOTE/Discharge   Patient Name: Max [name] MRN: [id number] DOB:06-14-1960, 61 y.o., male Today's Date: 09/21/2021  PCP: Caren Macadam, MD    PT End of Session - 09/21/21 0849     Visit Number 15    Number of Visits 24    Date for PT Re-Evaluation 10/12/21    PT Start Time 0845    PT Stop Time 0928    PT Time Calculation (min) 43 min    Activity Tolerance Patient tolerated treatment well    Behavior During Therapy [facility] for tasks assessed/performed                   Past Medical History:  Diagnosis Date   Abnormal renal function    states labs were elevated due to taking ANSAIDS-he is treated by a urologist in [place]    Chronic kidney disease    stage 2   Hamstring tear    left   Hypertension    Pre-diabetes    Testosterone deficiency    treated by Lyn Henri MD-[place]   Past Surgical History:  Procedure Laterality Date   HIP ARTHROSCOPY Left 06/19/2021   Procedure: ARTHROSCOPY LEFT HIP WITH LABRAL REPAIR, CARM AND PINOR DEBRIDEMENT;  Surgeon: Vanetta Mulders, MD;  Location: [facility];  Service: Orthopedics;  Laterality: Left;   NO PAST SURGERIES     Patient Active Problem List   Diagnosis Date Noted   Hip impingement syndrome, left    Left hip pain 12/28/2020   Allergy-induced asthma 09/10/2019   Hypertension 09/03/2019   Low testosterone 09/03/2019    REFERRING PROVIDER: Vanetta Mulders, MD   REFERRING DIAG: [phone] (ICD-10-CM) - Hip impingement syndrome, left   THERAPY DIAG:  Left Hip Pain  Lift Hip Stiffness  Other Abnormalities of Gait and Mobility        ONSET DATE: 2/72022     SUBJECTIVE:    SUBJECTIVE STATEMENT: Patient reports he I back to work. He is doing well but he has a long walk in. After the long walk in he has some anterior hip pain. He reports that is getting better the more he does it.  PERTINENT HISTORY: Remote hamstring tear; CKD, Arthritis of the left  hip; Pre-diabetes,    PAIN:  Are you having pain? No  NPRS scale: 0/10 Pain location: Anterior left hip  Pain orientation: Left  PAIN TYPE: aching Pain description: intermittent  Aggravating factors: Standing and walking/ siting for too long  Relieving factors: Rest/ medication    PRECAUTIONS: Other: follow hip arthroscopy protocol    WEIGHT BEARING RESTRICTIONS Yes  touchdown weightbearing on the left lower extremity in his brace per op note     OCCUPATION:  Drives a forklift    Recrational activity: Went to the gym      PLOF: Independent   PATIENT GOALS :    Get back to normal      OBJECTIVE:  LE AROM/PROM:   PROM Left 06/22/2021 LEFT 07/10/21 Left 4/26  Hip flexion 80 80   Hip extension      Hip abduction 8 15   Hip adduction      Hip internal rotation 5     Hip external rotation 5  In prone: 26   (Blank rows = not tested)    MMT Right 06/22/2021 Left 06/22/2021 Right/Left 4/24  Hip flexion  33.5  25.0 61/48.4  Hip extension       Hip abduction  49.1 43.6 57.5/43.2  Hip adduction       Hip internal rotation       Hip external rotation       Knee flexion       Knee extension  52.3 54.3    Ankle dorsiflexion       Ankle plantarflexion       Ankle inversion       Ankle eversion        (Blank rows = not tested)        TODAY'S TREATMENT: 5/12 Manual: ; grade III and IV inferior and posterior glides; Improved flexion ER and IR noted. Hip flexion stretch   Bridge x30  blue  Hip abduction Blue x30  Supine march x30 blue   Lateral band walk 3x10 blue  Forward band walk 3x10   Leg press 30 90 lbs   Step up x20 each leg 6 inch       4/26 MANUAL: STM to pectineus, illiopsoas & quads in lengthened position off side of table Supine hiip ER Sidelying clam Prone hip ext with knee flexed Prone hip ER AROM Child pose stretch Primal push up + clam Cable column hip ext & abd  4/24 Hip ER contract relax on table with back support 12" step  up with slow down- on triceps dip cable column Rows Gait: hip ext Shuttle 1*25 qped hip ext press Knee extension machine for qped hip ext press with knee flexed Manual HSS, passive hip flexion with post mobs  4/20 Manual: ; grade III and IV inferior and posterior glides; Improved flexion ER and IR noted. Hip flexion stretch  Lateral band walk 2x10 blue Squat 2x15 with blue and.  Band 3 way lunge 5x each direction blue in low range   Supine:  Bridge x30  blue  Hip abduction Blue x30   Step up x30 6 inch  Lateral step up x20 6 inch  Step down x20 4 inc     4/17 Manual: ; grade III and IV inferior and posterior glides; Improved flexion ER and IR noted. Hip flexion stretch  Lateral band walk 2x10 green Monster walk 2x10 with cuing for technique  Squat 2x15 with green band. Second set done in front of the mirror because he was shifting away from the left   Goblet squat 20 lbs 2x20     PATIENT EDUCATION:  Education details: exercise form/rationale Person educated: Patient and Spouse Education method: Explanation, Demonstration, Tactile cues, Verbal cues, and Handouts Education comprehension: verbalized understanding, returned demonstration, verbal cues required, tactile cues required, and needs further education     HOME EXERCISE PROGRAM:  Access Code: [email or web address] URL: [email or web address]     ASSESSMENT:   CLINICAL IMPRESSION: Patient is making good progress. His hip was a little tight in the beginning but improved with PROM and mobilization. He was advised to contnue self ER stretching at hme. Therapy reviewed his program that he will continue doing at home.     Objective impairments include Abnormal gait, decreased activity tolerance, decreased endurance, decreased knowledge of use of DME, difficulty walking, decreased ROM, decreased strength, increased muscle spasms, and pain. These impairments are limiting patient from cleaning, driving, meal prep,  occupation, yard work, and shopping. Personal factors including 1-2 comorbidities: arthritis   are also affecting patient's functional outcome. Patient will benefit from skilled PT to address above impairments and improve overall function.   REHAB POTENTIAL: Excellent   CLINICAL DECISION MAKING: Stable/uncomplicated   EVALUATION COMPLEXITY: Low  GOALS:   SHORT TERM GOALS:   STG Name Target Date Goal status  1 Patient will increase hip flexion to 90 degrees  Baseline:  07/13/2021 achieved  2 Patient will be independent with basic HEP  Baseline:  07/13/2021 achieved  3 Patient will begin weight bearing per Protocol Baseline: 07/13/2021 achieved  LONG TERM GOALS:    LTG Name Target Date Goal status  1 Patient will progress off crutches to full weight bearing as tolerated  Baseline: 08/17/2021 achieved  2 Patient will go up and down 8 steps with reciprocal gait  Baseline: 08/17/2021 achieved  3 Further functional goals will be established as protocol progresses  Baseline: 08/17/2021 achieved  4 Pt will demo eccentric control on LE for safe step down from high surface Baseline: able to control through approx 60 deg of hip flexion in post step down 10/12/21 new  5 Pt will demo strength 90% of opposite LE Baseline: see chart 10/12/21 new  6 Pt will don sock comfortably Baseline: improved flexibility but still with significant difficulty and discomfort in reaching 10/12/21 new  PLAN: PT FREQUENCY: 2x/week   PT DURATION: 8 weeks   PLANNED INTERVENTIONS: Therapeutic exercises, Therapeutic activity, Neuro Muscular re-education, Balance training, Gait training, Patient/Family education, Joint mobilization, Stair training, Aquatic Therapy, Electrical stimulation, Cryotherapy, Moist heat, Ultrasound, and Manual therapy   PLAN FOR NEXT SESSION: cont large range strength control for functional strength PHYSICAL THERAPY DISCHARGE SUMMARY  Visits from Start of Care: 15  Current functional level related  to goals / functional outcomes: Significant increase in pain and function    Remaining deficits: None    Education / Equipment: HEP   Patient agrees to discharge. Patient goals were met. Patient is being discharged due to meeting the stated rehab goals.   Burnis Medin PT DPT  09/21/21 9:11 AM

## 2021-09-27 ENCOUNTER — Other Ambulatory Visit: Payer: Self-pay | Admitting: Orthopedic Surgery

## 2021-09-27 ENCOUNTER — Telehealth: Payer: Self-pay | Admitting: Orthopaedic Surgery

## 2021-09-27 MED ORDER — TRAMADOL HCL 50 MG PO TABS
50.0000 mg | ORAL_TABLET | Freq: Four times a day (QID) | ORAL | 0 refills | Status: AC | PRN
Start: 2021-09-27 — End: 2021-10-04

## 2021-09-27 NOTE — Telephone Encounter (Signed)
IC advised.  

## 2021-09-27 NOTE — Telephone Encounter (Signed)
Pt called asking for refill pain medication for hip pains. Pt asked for medication to be sent to [facility] in [place]. Please call pt about this matter at [phone]

## 2021-09-28 ENCOUNTER — Encounter (HOSPITAL_BASED_OUTPATIENT_CLINIC_OR_DEPARTMENT_OTHER): Payer: 59 | Admitting: Physical Therapy

## 2021-10-03 ENCOUNTER — Other Ambulatory Visit: Payer: Self-pay | Admitting: Family Medicine

## 2021-10-04 ENCOUNTER — Encounter (HOSPITAL_BASED_OUTPATIENT_CLINIC_OR_DEPARTMENT_OTHER): Payer: 59 | Admitting: Physical Therapy

## 2021-10-05 ENCOUNTER — Ambulatory Visit (HOSPITAL_BASED_OUTPATIENT_CLINIC_OR_DEPARTMENT_OTHER): Payer: 59 | Admitting: Orthopaedic Surgery

## 2021-10-12 ENCOUNTER — Ambulatory Visit (HOSPITAL_BASED_OUTPATIENT_CLINIC_OR_DEPARTMENT_OTHER): Payer: 59 | Admitting: Orthopaedic Surgery

## 2021-10-14 ENCOUNTER — Other Ambulatory Visit: Payer: Self-pay | Admitting: Family Medicine

## 2021-10-14 DIAGNOSIS — I10 Essential (primary) hypertension: Secondary | ICD-10-CM

## 2021-10-15 ENCOUNTER — Other Ambulatory Visit: Payer: Self-pay | Admitting: Family Medicine

## 2021-10-15 DIAGNOSIS — I10 Essential (primary) hypertension: Secondary | ICD-10-CM

## 2021-10-19 ENCOUNTER — Ambulatory Visit (INDEPENDENT_AMBULATORY_CARE_PROVIDER_SITE_OTHER): Payer: 59 | Admitting: Orthopaedic Surgery

## 2021-10-19 DIAGNOSIS — M25852 Other specified joint disorders, left hip: Secondary | ICD-10-CM | POA: Diagnosis not present

## 2021-10-19 NOTE — Progress Notes (Signed)
Post Operative Evaluation    Procedure/Date of Surgery: left hip labral debridement with cam and pincer debridement June 19, 2021  Interval History:    Presents today for follow-up status post left hip arthroscopy and debridement.  Overall he is doing extremely well.  He is very happy at surgery.  He is back to work without any significant pain.  PMH/PSH/Family History/Social History/Meds/Allergies:    Past Medical History:  Diagnosis Date   Abnormal renal function    states labs were elevated due to taking ANSAIDS-he is treated by a urologist in [place] kidney disease    stage 2   Hamstring tear    left   Hypertension    Pre-diabetes    Testosterone deficiency    treated by Lyn Henri MD-[place]   Past Surgical History:  Procedure Laterality Date   HIP ARTHROSCOPY Left 06/19/2021   Procedure: ARTHROSCOPY LEFT HIP WITH LABRAL REPAIR, CARM AND PINOR DEBRIDEMENT;  Surgeon: Vanetta Mulders, MD;  Location: [facility];  Service: Orthopedics;  Laterality: Left;   NO PAST SURGERIES     Social History   Socioeconomic History   Marital status: Single    Spouse name: Not on file   Number of children: Not on file   Years of education: Not on file   Highest education level: Not on file  Occupational History   Not on file  Tobacco Use   Smoking status: Never   Smokeless tobacco: Never  Vaping Use   Vaping Use: Never used  Substance and Sexual Activity   Alcohol use: Not Currently    Comment: quit at age 67   Drug use: Never   Sexual activity: Yes  Other Topics Concern   Not on file  Social History Narrative   Not on file   Social Determinants of Health   Financial Resource Strain: Not on file  Food Insecurity: Not on file  Transportation Needs: Not on file  Physical Activity: Not on file  Stress: Not on file  Social Connections: Not on file   Family History  Problem Relation Age of Onset    Hypertension Mother    Hypertension Father    Bowel Disease Father        obstruction   Diabetes Sister    Hypertension Sister    Hypertension Sister    Allergies  Allergen Reactions   Ibuprofen     Affects kidney function significantly   Current Outpatient Medications  Medication Sig Dispense Refill   acetaminophen (TYLENOL) 500 MG tablet Take 500 mg by mouth every 8 (eight) hours as needed for moderate pain.     amLODipine (NORVASC) 10 MG tablet TAKE 1 TABLET BY MOUTH  DAILY 90 tablet 3   aspirin EC 325 MG tablet Take 1 tablet (325 mg total) by mouth daily. 30 tablet 0   Blood Pressure Monitoring (ADULT BLOOD PRESSURE CUFF LG) KIT 1 Device by Does not apply route daily as needed. 1 kit 0   carboxymethylcellulose (REFRESH PLUS) 0.5 % SOLN 1 drop 3 (three) times daily as needed (dry eyes).     cholecalciferol (VITAMIN D3) 25 MCG (1000 UNIT) tablet Take 1,000 Units by mouth daily.     rosuvastatin (CRESTOR) 5 MG tablet Take 1 tablet (5 mg total) by mouth daily. [place]  tablet 1   sertraline (ZOLOFT) 25 MG tablet Take 1 tablet (25 mg total) by mouth daily. 90 tablet 1   Spacer/Aero-Holding Chambers DEVI 1 Device by Does not apply route daily as needed. 1 Device 0   tadalafil (CIALIS) 5 MG tablet TAKE 1 TABLET BY MOUTH DAILY 90 tablet 0   testosterone cypionate (DEPOTESTOSTERONE CYPIONATE) 200 MG/ML injection Inject 1 mL (200 mg total) into the muscle once a week. 12 mL 3   traZODone (DESYREL) 50 MG tablet Take 1 tablet (50 mg total) by mouth at bedtime as needed for sleep. 90 tablet 3   valsartan (DIOVAN) 40 MG tablet TAKE 1 TABLET BY MOUTH EVERY DAY 90 tablet 1   No current facility-administered medications for this visit.   No results found.  Review of Systems:   A ROS was performed including pertinent positives and negatives as documented in the HPI.   Musculoskeletal Exam:    There were no vitals taken for this visit.  Left hip portal incisions are healed.  Internal rotation is  to 30 degrees external rotation is to 45 degrees without pain.  Flexion is to 120 degrees without pain.  Excellent strength with flexion and abduction  I personally reviewed and interpreted the radiographs.   Assessment:   61 year old male 4 months status post left hip arthroscopy overall doing extremely well.  At this time he will return to clinic as needed.  Plan :    -Return to clinic as needed      I personally saw and evaluated the patient, and participated in the management and treatment plan.  Vanetta Mulders, MD Attending Physician, Orthopedic Surgery  This document was dictated using Dragon voice recognition software. A reasonable attempt at proof reading has been made to minimize errors.

## 2021-11-07 ENCOUNTER — Other Ambulatory Visit: Payer: Self-pay | Admitting: Family

## 2021-11-07 ENCOUNTER — Telehealth: Payer: Self-pay | Admitting: Family Medicine

## 2021-11-07 DIAGNOSIS — R454 Irritability and anger: Secondary | ICD-10-CM

## 2021-11-07 MED ORDER — SERTRALINE HCL 25 MG PO TABS: 25.0000 mg | ORAL_TABLET | Freq: Every day | ORAL | 1 refills | Status: DC

## 2021-11-07 NOTE — Telephone Encounter (Signed)
Pt requesting a refill of sertraline (ZOLOFT) 25 MG tablet    [id number] [place], [place] - [address] [place] Phone:  [phone]  Fax:  [phone]

## 2021-11-08 NOTE — Telephone Encounter (Signed)
Patient informed of the message below.

## 2021-11-22 ENCOUNTER — Other Ambulatory Visit: Payer: Self-pay | Admitting: *Deleted

## 2021-11-22 MED ORDER — TADALAFIL 5 MG PO TABS: 5.0000 mg | ORAL_TABLET | Freq: Every day | ORAL | 0 refills | Status: DC

## 2021-12-27 ENCOUNTER — Inpatient Hospital Stay: Payer: 59 | Admitting: Family Medicine

## 2022-01-10 ENCOUNTER — Other Ambulatory Visit: Payer: Self-pay | Admitting: *Deleted

## 2022-01-10 DIAGNOSIS — I10 Essential (primary) hypertension: Secondary | ICD-10-CM

## 2022-01-11 MED ORDER — ROSUVASTATIN CALCIUM 5 MG PO TABS
5.0000 mg | ORAL_TABLET | Freq: Every day | ORAL | 0 refills | Status: DC
Start: 2022-01-11 — End: 2022-05-30

## 2022-01-11 MED ORDER — AMLODIPINE BESYLATE 10 MG PO TABS: 10.0000 mg | ORAL_TABLET | Freq: Every day | ORAL | 0 refills | Status: DC

## 2022-01-18 ENCOUNTER — Ambulatory Visit (INDEPENDENT_AMBULATORY_CARE_PROVIDER_SITE_OTHER): Payer: 59 | Admitting: Family Medicine

## 2022-01-18 ENCOUNTER — Encounter: Payer: Self-pay | Admitting: Family Medicine

## 2022-01-18 VITALS — BP 100/60 | HR 63 | Temp 98.6°F | Ht 68.75 in | Wt 184.2 lb

## 2022-01-18 DIAGNOSIS — J069 Acute upper respiratory infection, unspecified: Secondary | ICD-10-CM | POA: Diagnosis not present

## 2022-01-18 DIAGNOSIS — R0981 Nasal congestion: Secondary | ICD-10-CM

## 2022-01-18 LAB — POC COVID19 BINAXNOW: SARS Coronavirus 2 Ag: NEGATIVE

## 2022-01-18 MED ORDER — PROMETHAZINE-DM 6.25-15 MG/5ML PO SYRP: 5.0000 mL | ORAL_SOLUTION | Freq: Four times a day (QID) | ORAL | 0 refills | Status: DC | PRN

## 2022-01-18 NOTE — Progress Notes (Signed)
   Established Patient Office Visit  Subjective   Patient ID: Max [name], male    DOB: 08-Dec-1960  Age: 61 y.o. MRN: [id number]  Chief Complaint  Patient presents with  . Sinus Problem    Patient complains of sinus congestion and runny nose x1 day, tried allergy medication  . Cough    Non-productive x1 day    Sinus Problem This is a new problem. The current episode started yesterday. The problem is unchanged. There has been no fever. He is experiencing no pain. Associated symptoms include congestion, coughing, sinus pressure and sneezing. Pertinent negatives include no chills, neck pain or sore throat. Past treatments include acetaminophen. The treatment provided mild relief.  Cough Pertinent negatives include no chills or sore throat.    {History (Optional):23778}  Review of Systems  Constitutional:  Negative for chills.  HENT:  Positive for congestion, sinus pressure and sneezing. Negative for sore throat.   Respiratory:  Positive for cough.   Musculoskeletal:  Negative for neck pain.  All other systems reviewed and are negative.     Objective:     BP 100/60 (BP Location: Left Arm, Patient Position: Sitting, Cuff Size: Large)   Pulse 63   Temp 98.6 F (37 C) (Oral)   Ht 5' 8.75" (1.746 m)   Wt 184 lb 3.2 oz (83.6 kg)   SpO2 97%   BMI 27.40 kg/m  {Vitals History (Optional):23777}  Physical Exam Vitals reviewed.  Constitutional:      Appearance: Normal appearance. He is well-groomed and normal weight.  HENT:     Head: Normocephalic and atraumatic.  Eyes:     Extraocular Movements: Extraocular movements intact.     Conjunctiva/sclera: Conjunctivae normal.     Pupils: Pupils are equal, round, and reactive to light.  Cardiovascular:     Rate and Rhythm: Normal rate and regular rhythm.     Pulses: Normal pulses.     Heart sounds: S1 normal and S2 normal. No murmur heard. Pulmonary:     Effort: Pulmonary effort is normal.     Breath sounds: Normal breath  sounds and air entry. No rales.  Abdominal:     General: Abdomen is flat. Bowel sounds are normal.     Palpations: Abdomen is soft.     Tenderness: There is no abdominal tenderness.  Musculoskeletal:     Right lower leg: No edema.     Left lower leg: No edema.  Neurological:     General: No focal deficit present.     Mental Status: He is alert and oriented to person, place, and time.     Gait: Gait is intact.     Deep Tendon Reflexes: Reflexes are normal and symmetric.  Psychiatric:        Mood and Affect: Mood and affect normal.     Results for orders placed or performed in visit on 01/18/22  POC COVID-19  Result Value Ref Range   SARS Coronavirus 2 Ag Negative Negative    {Labs (Optional):23779}  The 10-year ASCVD risk score (Arnett DK, et al., 2019) is: 15.2%    Assessment & Plan:   Problem List Items Addressed This Visit   None Visit Diagnoses     Nasal congestion    -  Primary   Relevant Orders   POC COVID-19 (Completed)       No follow-ups on file.    Karie Georges, MD

## 2022-01-21 NOTE — Assessment & Plan Note (Addendum)
Most likely diagnosis, his COVID test is negative,recommended symptomatic treatment OTC. Will rx promethazine DM for his cough, 5-10 ml every 6 hours PRN.

## 2022-01-24 ENCOUNTER — Encounter: Payer: 59 | Admitting: Family Medicine

## 2022-02-04 ENCOUNTER — Encounter: Payer: Self-pay | Admitting: Family Medicine

## 2022-02-04 ENCOUNTER — Ambulatory Visit (INDEPENDENT_AMBULATORY_CARE_PROVIDER_SITE_OTHER): Payer: 59 | Admitting: Family Medicine

## 2022-02-04 VITALS — BP 118/70 | HR 80 | Temp 97.7°F | Ht 68.75 in | Wt 184.9 lb

## 2022-02-04 DIAGNOSIS — Z23 Encounter for immunization: Secondary | ICD-10-CM | POA: Diagnosis not present

## 2022-02-04 DIAGNOSIS — E1169 Type 2 diabetes mellitus with other specified complication: Secondary | ICD-10-CM | POA: Diagnosis not present

## 2022-02-04 DIAGNOSIS — R454 Irritability and anger: Secondary | ICD-10-CM

## 2022-02-04 DIAGNOSIS — I10 Essential (primary) hypertension: Secondary | ICD-10-CM

## 2022-02-04 DIAGNOSIS — N521 Erectile dysfunction due to diseases classified elsewhere: Secondary | ICD-10-CM

## 2022-02-04 DIAGNOSIS — E782 Mixed hyperlipidemia: Secondary | ICD-10-CM

## 2022-02-04 DIAGNOSIS — E785 Hyperlipidemia, unspecified: Secondary | ICD-10-CM

## 2022-02-04 MED ORDER — SERTRALINE HCL 50 MG PO TABS: 50.0000 mg | ORAL_TABLET | Freq: Every day | ORAL | 1 refills | Status: DC

## 2022-02-04 MED ORDER — TADALAFIL 5 MG PO TABS: 5.0000 mg | ORAL_TABLET | Freq: Every day | ORAL | 1 refills | Status: DC

## 2022-02-04 NOTE — Progress Notes (Signed)
Established Patient Office Visit  Subjective   Patient ID: Max [name], male    DOB: 08-03-1960  Age: 61 y.o. MRN: [id number]  Chief Complaint  Patient presents with  . Establish Care    Paitent is here for regular follow up appointment. Patient reports he is doing better since he had the cold a couple week   HLD-- patient was started on crestor 5 mg in May after his cholesterol test. Pt reports he is doing well with his diet, reducing cholesterol, and he was wondering if he could come off the medication, he denies any side effects.   HTN -- BP in office performed and is well controlled. She reports no side effects to the medications, no chest pain, SOB, dizziness or headaches. She has a BP cuff at home and is checking her BP regularly, reports they are in the normal range.    Anxiety/ irritability-- pt reports that he is taking the 25 mg daily of the sertraline, states that it works "ok" but that sometimes he still stays very stressed. Sometimes it leads ti increased frustration and irritability. Reports that he is only taking the trazodone at night as needed but it is working well for his sleep issues.   Current Outpatient Medications  Medication Instructions  . acetaminophen (TYLENOL) 500 mg, Oral, Every 8 hours PRN  . amLODipine (NORVASC) 10 mg, Oral, Daily  . Blood Pressure Monitoring (ADULT BLOOD PRESSURE CUFF LG) KIT 1 Device, Does not apply, Daily PRN  . carboxymethylcellulose (REFRESH PLUS) 0.5 % SOLN 1 drop, 3 times daily PRN  . cholecalciferol (VITAMIN D3) 1,000 Units, Oral, Daily  . promethazine-dextromethorphan (PROMETHAZINE-DM) 6.25-15 MG/5ML syrup 5 mLs, Oral, 4 times daily PRN  . rosuvastatin (CRESTOR) 5 mg, Oral, Daily  . sertraline (ZOLOFT) 50 mg, Oral, Daily  . tadalafil (CIALIS) 5 mg, Oral, Daily  . testosterone cypionate (DEPOTESTOSTERONE CYPIONATE) 200 mg, Intramuscular, Weekly  . traZODone (DESYREL) 50 mg, Oral, At bedtime PRN  . valsartan (DIOVAN) 40 MG  tablet TAKE 1 TABLET BY MOUTH EVERY DAY    Patient Active Problem List   Diagnosis Date Noted  . HLD (hyperlipidemia) 02/04/2022  . Acute upper respiratory infection 01/18/2022  . Hip impingement syndrome, left   . Left hip pain 12/28/2020  . Allergy-induced asthma 09/10/2019  . Hypertension 09/03/2019  . Low testosterone 09/03/2019      Review of Systems  All other systems reviewed and are negative.     Objective:     BP 118/70 (BP Location: Left Arm, Patient Position: Sitting, Cuff Size: Large)   Pulse 80   Temp 97.7 F (36.5 C) (Oral)   Ht 5' 8.75" (1.746 m)   Wt 184 lb 14.4 oz (83.9 kg)   SpO2 100%   BMI 27.50 kg/m  BP Readings from Last 3 Encounters:  02/04/22 118/70  01/18/22 100/60  09/05/21 110/78      Physical Exam Vitals reviewed.  Constitutional:      Appearance: Normal appearance. He is well-groomed and normal weight.  Eyes:     Extraocular Movements: Extraocular movements intact.     Conjunctiva/sclera: Conjunctivae normal.  Neck:     Thyroid: No thyromegaly.  Cardiovascular:     Rate and Rhythm: Normal rate and regular rhythm.     Heart sounds: S1 normal and S2 normal. No murmur heard. Pulmonary:     Effort: Pulmonary effort is normal.     Breath sounds: Normal breath sounds and air entry. No rales.  Abdominal:  General: Abdomen is flat. Bowel sounds are normal.  Musculoskeletal:     Right lower leg: No edema.     Left lower leg: No edema.  Neurological:     General: No focal deficit present.     Mental Status: He is alert and oriented to person, place, and time.     Gait: Gait is intact.  Psychiatric:        Mood and Affect: Mood and affect normal.     No results found for any visits on 02/04/22.  Last CBC Lab Results  Component Value Date   WBC 6.5 09/05/2021   HGB 15.9 09/05/2021   HCT 47.6 09/05/2021   MCV 90.5 09/05/2021   MCH 29.2 06/13/2021   RDW 15.4 09/05/2021   PLT 278.0 24/26/8341   Last metabolic panel Lab  Results  Component Value Date   GLUCOSE 89 09/05/2021   NA 141 09/05/2021   K 4.1 09/05/2021   CL 103 09/05/2021   CO2 28 09/05/2021   BUN 13 09/05/2021   CREATININE 1.14 09/05/2021   GFRNONAA >60 06/13/2021   CALCIUM 9.8 09/05/2021   PROT 7.0 09/05/2021   ALBUMIN 4.4 09/05/2021   BILITOT 0.4 09/05/2021   ALKPHOS 65 09/05/2021   AST 23 09/05/2021   ALT 33 09/05/2021   ANIONGAP 10 06/13/2021      The 10-year ASCVD risk score (Arnett DK, et al., 2019) is: 20.2%    Assessment & Plan:   Problem List Items Addressed This Visit       Cardiovascular and Mediastinum   Hypertension    Current hypertension medications:       Sig   amLODipine (NORVASC) 10 MG tablet (Taking) Take 1 tablet (10 mg total) by mouth daily.   valsartan (DIOVAN) 40 MG tablet (Taking) TAKE 1 TABLET BY MOUTH EVERY DAY   tadalafil (CIALIS) 5 MG tablet Take 1 tablet (5 mg total) by mouth daily.  Well controlled today on the current medications, will continue as prescribed above.       Relevant Medications   tadalafil (CIALIS) 5 MG tablet     Other   HLD (hyperlipidemia) (Chronic)    Patient is on 5 mg crestor daily, doing well with the medication, he needs a new lipid panel and CMP today to determine if his LDL is at goal and to look at his liver function tests for surveillance.       Relevant Medications   tadalafil (CIALIS) 5 MG tablet   Other Relevant Orders   CMP   Lipid Panel   Other Visit Diagnoses     Hyperlipidemia associated with type 2 diabetes mellitus ([place])    -  Primary   Relevant Medications   tadalafil (CIALIS) 5 MG tablet   Irritability       Relevant Medications   sertraline (ZOLOFT) 50 MG tablet   Immunization due       Relevant Orders   Flu Vaccine QUAD 6+ mos PF IM (Fluarix Quad PF)   Erectile dysfunction due to diseases classified elsewhere       Relevant Medications   tadalafil (CIALIS) 5 MG tablet       Return in about 6 months (around 08/05/2022) for follow up  HTN and anxiety.    Farrel Conners, MD

## 2022-02-04 NOTE — Assessment & Plan Note (Signed)
Patient is on 5 mg crestor daily, doing well with the medication, he needs a new lipid panel and CMP today to determine if his LDL is at goal and to look at his liver function tests for surveillance.

## 2022-02-04 NOTE — Assessment & Plan Note (Signed)
Current hypertension medications:      Sig   amLODipine (NORVASC) 10 MG tablet (Taking) Take 1 tablet (10 mg total) by mouth daily.   valsartan (DIOVAN) 40 MG tablet (Taking) TAKE 1 TABLET BY MOUTH EVERY DAY   tadalafil (CIALIS) 5 MG tablet Take 1 tablet (5 mg total) by mouth daily.     Well controlled today on the current medications, will continue as prescribed above.

## 2022-02-07 ENCOUNTER — Other Ambulatory Visit: Payer: 59

## 2022-02-13 ENCOUNTER — Other Ambulatory Visit: Payer: Self-pay | Admitting: Family

## 2022-02-13 DIAGNOSIS — N521 Erectile dysfunction due to diseases classified elsewhere: Secondary | ICD-10-CM

## 2022-02-14 ENCOUNTER — Other Ambulatory Visit (INDEPENDENT_AMBULATORY_CARE_PROVIDER_SITE_OTHER): Payer: 59

## 2022-02-14 DIAGNOSIS — E782 Mixed hyperlipidemia: Secondary | ICD-10-CM

## 2022-02-14 LAB — COMPREHENSIVE METABOLIC PANEL
ALT: 18 U/L (ref 0–53)
AST: 22 U/L (ref 0–37)
Albumin: 4.3 g/dL (ref 3.5–5.2)
Alkaline Phosphatase: 53 U/L (ref 39–117)
BUN: 11 mg/dL (ref 6–23)
CO2: 31 mEq/L (ref 19–32)
Calcium: 9.4 mg/dL (ref 8.4–10.5)
Chloride: 104 mEq/L (ref 96–112)
Creatinine, Ser: 1.48 mg/dL (ref 0.40–1.50)
GFR: 51.29 mL/min — ABNORMAL LOW (ref >60.00)
Glucose, Bld: 96 mg/dL (ref 70–99)
Potassium: 3.9 mEq/L (ref 3.5–5.1)
Sodium: 140 mEq/L (ref 135–145)
Total Bilirubin: 0.5 mg/dL (ref 0.2–1.2)
Total Protein: 6.9 g/dL (ref 6.0–8.3)

## 2022-02-14 LAB — LIPID PANEL
Cholesterol: 147 mg/dL (ref 0–200)
HDL: 52.4 mg/dL (ref >39.00)
LDL Cholesterol: 80 mg/dL (ref 0–99)
NonHDL: 94.54
Total CHOL/HDL Ratio: 3
Triglycerides: 75 mg/dL (ref 0.0–149.0)
VLDL: 15 mg/dL (ref 0.0–40.0)

## 2022-04-08 ENCOUNTER — Telehealth: Payer: Self-pay

## 2022-04-08 NOTE — Telephone Encounter (Signed)
Pt called into the office requesting refill on valsartan (DIOVAN) 40 MG tablet  would like [facility] to [id number] [place], [place] - [address] [place]

## 2022-04-09 ENCOUNTER — Other Ambulatory Visit: Payer: Self-pay | Admitting: *Deleted

## 2022-04-09 MED ORDER — VALSARTAN 40 MG PO TABS: 40.0000 mg | ORAL_TABLET | Freq: Every day | ORAL | 1 refills | Status: DC

## 2022-05-02 ENCOUNTER — Other Ambulatory Visit: Payer: Self-pay | Admitting: Family Medicine

## 2022-05-02 DIAGNOSIS — I10 Essential (primary) hypertension: Secondary | ICD-10-CM

## 2022-05-30 ENCOUNTER — Other Ambulatory Visit: Payer: Self-pay | Admitting: Family Medicine

## 2022-07-12 ENCOUNTER — Ambulatory Visit: Payer: 59 | Admitting: Family Medicine

## 2022-07-12 NOTE — Progress Notes (Deleted)
ACUTE VISIT No chief complaint on file.  HPI: [name] is a 62 y.o. male, who is here today complaining of *** HPI  Review of Systems See other pertinent positives and negatives in HPI.  Current Outpatient Medications on File Prior to Visit  Medication Sig Dispense Refill   acetaminophen (TYLENOL) 500 MG tablet Take 500 mg by mouth every 8 (eight) hours as needed for moderate pain.     amLODipine (NORVASC) 10 MG tablet TAKE 1 TABLET BY MOUTH EVERY DAY 90 tablet 0   Blood Pressure Monitoring (ADULT BLOOD PRESSURE CUFF LG) KIT 1 Device by Does not apply route daily as needed. 1 kit 0   carboxymethylcellulose (REFRESH PLUS) 0.5 % SOLN 1 drop 3 (three) times daily as needed (dry eyes).     cholecalciferol (VITAMIN D3) 25 MCG (1000 UNIT) tablet Take 1,000 Units by mouth daily.     promethazine-dextromethorphan (PROMETHAZINE-DM) 6.25-15 MG/5ML syrup Take 5 mLs by mouth 4 (four) times daily as needed for cough. 240 mL 0   rosuvastatin (CRESTOR) 5 MG tablet TAKE 1 TABLET (5 MG TOTAL) BY MOUTH DAILY. 90 tablet 3   sertraline (ZOLOFT) 50 MG tablet Take 1 tablet (50 mg total) by mouth daily. 90 tablet 1   tadalafil (CIALIS) 5 MG tablet Take 1 tablet (5 mg total) by mouth daily. 90 tablet 1   testosterone cypionate (DEPOTESTOSTERONE CYPIONATE) 200 MG/ML injection Inject 1 mL (200 mg total) into the muscle once a week. 12 mL 3   traZODone (DESYREL) 50 MG tablet Take 1 tablet (50 mg total) by mouth at bedtime as needed for sleep. 90 tablet 3   valsartan (DIOVAN) 40 MG tablet Take 1 tablet (40 mg total) by mouth daily. 90 tablet 1   No current facility-administered medications on file prior to visit.    Past Medical History:  Diagnosis Date   Abnormal renal function    states labs were elevated due to taking ANSAIDS-he is treated by a urologist in [place] kidney disease    stage 2   Hamstring tear    left   Hypertension    Pre-diabetes     Testosterone deficiency    treated by [facility]   Allergies  Allergen Reactions   Ibuprofen     Affects kidney function significantly    Social History   Socioeconomic History   Marital status: Single    Spouse name: Not on file   Number of children: Not on file   Years of education: Not on file   Highest education level: Not on file  Occupational History   Not on file  Tobacco Use   Smoking status: Never   Smokeless tobacco: Never  Vaping Use   Vaping Use: Never used  Substance and Sexual Activity   Alcohol use: Not Currently    Comment: quit at age 26   Drug use: Never   Sexual activity: Yes  Other Topics Concern   Not on file  Social History Narrative   Not on file   Social Determinants of Health   Financial Resource Strain: Not on file  Food Insecurity: Not on file  Transportation Needs: Not on file  Physical Activity: Not on file  Stress: Not on file  Social Connections: Not on file    There were no vitals filed for this visit. There is no height or weight on file to calculate BMI.  Physical Exam  ASSESSMENT AND PLAN: There  are no diagnoses linked to this encounter.  No follow-ups on file.  Betty G. Martinique, MD  [facility]. [place] office.  Discharge Instructions   None

## 2022-07-17 ENCOUNTER — Encounter: Payer: Self-pay | Admitting: Family Medicine

## 2022-07-17 ENCOUNTER — Other Ambulatory Visit: Payer: Self-pay | Admitting: Family Medicine

## 2022-07-17 ENCOUNTER — Ambulatory Visit: Payer: 59 | Admitting: Family Medicine

## 2022-07-17 VITALS — BP 124/70 | HR 96 | Temp 98.5°F | Ht 68.0 in | Wt 187.2 lb

## 2022-07-17 DIAGNOSIS — Z1211 Encounter for screening for malignant neoplasm of colon: Secondary | ICD-10-CM

## 2022-07-17 DIAGNOSIS — J209 Acute bronchitis, unspecified: Secondary | ICD-10-CM | POA: Diagnosis not present

## 2022-07-17 MED ORDER — ALBUTEROL SULFATE HFA 108 (90 BASE) MCG/ACT IN AERS: 2.0000 | INHALATION_SPRAY | Freq: Four times a day (QID) | RESPIRATORY_TRACT | 0 refills | Status: DC | PRN

## 2022-07-17 MED ORDER — GUAIFENESIN-CODEINE 100-10 MG/5ML PO SOLN: 10.0000 mL | ORAL | 0 refills | Status: DC | PRN

## 2022-07-17 MED ORDER — PREDNISONE 20 MG PO TABS: ORAL_TABLET | ORAL | 0 refills | Status: AC

## 2022-07-17 NOTE — Progress Notes (Signed)
Acute Office Visit  Subjective:     Patient ID: Max [name], male    DOB: Jun 18, 1960, 62 y.o.   MRN: [id number]  Chief Complaint  Patient presents with   Cough    Non-productive x2 weeks, states his chest hurts when coughing, he was seen by 2 urgent cares, initially given Promethazine cough syrup and Zyrtec, negative Covid test, diagnosed with strep throat 2 weeks ago, antibiotic given   grief    Patient's daughter passed away suddenly 1 month ago    Pt reports that he has had cough and was initially diagnosed with allergies, was given cough medication and antihistamines, states he went back and was told that he had strep and they treated with antibiotics. Maybe got a little bit better but continued to have dry constant cough. No fever/chills, states that he finished the antibiotics but the cough has perissted. States that it is hurting in his back when he cough or sneeze.     Review of Systems  All other systems reviewed and are negative.       Objective:    BP 124/70 Comment: repeated by Mykal--jaf  Pulse 96   Temp 98.5 F (36.9 C) (Oral)   Ht '5\' 8"'$  (1.727 m)   Wt 187 lb 3.2 oz (84.9 kg)   SpO2 98%   BMI 28.46 kg/m    Physical Exam Vitals reviewed.  Constitutional:      Appearance: Normal appearance. He is normal weight.  HENT:     Right Ear: Tympanic membrane and ear canal normal.     Left Ear: Tympanic membrane and ear canal normal.     Mouth/Throat:     Mouth: Mucous membranes are moist.     Pharynx: No posterior oropharyngeal erythema.  Eyes:     Conjunctiva/sclera: Conjunctivae normal.  Cardiovascular:     Rate and Rhythm: Normal rate and regular rhythm.     Heart sounds: Normal heart sounds.  Pulmonary:     Effort: Pulmonary effort is normal.     Breath sounds: Normal breath sounds. No stridor. No wheezing or rhonchi.  Neurological:     Mental Status: He is alert.     No results found for any visits on 07/17/22.      Assessment & Plan:    Problem List Items Addressed This Visit   None Visit Diagnoses     Acute bronchitis, unspecified organism    -  Primary   Relevant Medications   predniSONE (DELTASONE) 20 MG tablet   albuterol (VENTOLIN HFA) 108 (90 Base) MCG/ACT inhaler   guaiFENesin-codeine 100-10 MG/5ML syrup   Colon cancer screening       Relevant Orders   Cologuard     Persistent cough despite treatment with 4 days of steroids, cough syrup and antibiotics. I recommended starting a longer course of steroids and will give him a PRN cough syrup as prescribed below. He was advised to continue cetirizine 10 mg daily OTC.   Meds ordered this encounter  Medications   predniSONE (DELTASONE) 20 MG tablet    Sig: Take 3 tablets (60 mg total) by mouth daily with breakfast for 2 days, THEN 2 tablets (40 mg total) daily with breakfast for 2 days, THEN 1 tablet (20 mg total) daily with breakfast for 2 days, THEN 0.5 tablets (10 mg total) daily with breakfast for 2 days.    Dispense:  13 tablet    Refill:  0   albuterol (VENTOLIN HFA) 108 (90 Base) MCG/ACT  inhaler    Sig: Inhale 2 puffs into the lungs every 6 (six) hours as needed for wheezing or shortness of breath.    Dispense:  8 g    Refill:  0   guaiFENesin-codeine 100-10 MG/5ML syrup    Sig: Take 10 mLs by mouth every 4 (four) hours as needed for cough.    Dispense:  180 mL    Refill:  0    No follow-ups on file.  Farrel Conners, MD

## 2022-07-17 NOTE — Patient Instructions (Signed)
Cetirizine 10 mg daily-- over the counter

## 2022-08-08 ENCOUNTER — Encounter: Payer: Self-pay | Admitting: Family Medicine

## 2022-08-08 ENCOUNTER — Ambulatory Visit (INDEPENDENT_AMBULATORY_CARE_PROVIDER_SITE_OTHER): Payer: 59 | Admitting: Family Medicine

## 2022-08-08 VITALS — BP 138/84 | HR 72 | Temp 97.5°F | Wt 182.0 lb

## 2022-08-08 DIAGNOSIS — S29019A Strain of muscle and tendon of unspecified wall of thorax, initial encounter: Secondary | ICD-10-CM

## 2022-08-08 DIAGNOSIS — R454 Irritability and anger: Secondary | ICD-10-CM

## 2022-08-08 MED ORDER — SERTRALINE HCL 50 MG PO TABS: 50.0000 mg | ORAL_TABLET | Freq: Every day | ORAL | 0 refills | Status: DC

## 2022-08-08 MED ORDER — CYCLOBENZAPRINE HCL 10 MG PO TABS: 10.0000 mg | ORAL_TABLET | Freq: Three times a day (TID) | ORAL | 0 refills | Status: DC | PRN

## 2022-08-08 MED ORDER — METHYLPREDNISOLONE 4 MG PO TBPK: ORAL_TABLET | ORAL | 0 refills | Status: DC

## 2022-08-08 NOTE — Progress Notes (Signed)
   Subjective:    Patient ID: Marla Roe, male    DOB: 08-06-1960, 62 y.o.   MRN: [id number]  HPI Here for 3 weeks of right sided middle back pain. No hx of trauma, but he does some heavy lifting on his job. He has tried heat and Tylenol with little relief. No pain radiates to the legs .   Review of Systems  Constitutional: Negative.   Respiratory: Negative.    Cardiovascular: Negative.   Musculoskeletal:  Positive for back pain.       Objective:   Physical Exam Constitutional:      General: He is not in acute distress.    Appearance: Normal appearance.  Cardiovascular:     Rate and Rhythm: Normal rate and regular rhythm.     Pulses: Normal pulses.     Heart sounds: Normal heart sounds.  Pulmonary:     Effort: Pulmonary effort is normal.     Breath sounds: Normal breath sounds.  Musculoskeletal:     Comments: The spine has full ROM. He is tender along the right side of the thoracic spine   Neurological:     Mental Status: He is alert.           Assessment & Plan:  Thoracic strain. He will try Flexeril and a Medrol dose pack. Recheck as needed.  Alysia Penna, MD

## 2022-08-22 ENCOUNTER — Telehealth: Payer: Self-pay | Admitting: Family Medicine

## 2022-08-22 DIAGNOSIS — N521 Erectile dysfunction due to diseases classified elsewhere: Secondary | ICD-10-CM

## 2022-08-22 MED ORDER — TADALAFIL 5 MG PO TABS: 5.0000 mg | ORAL_TABLET | Freq: Every day | ORAL | 0 refills | Status: DC

## 2022-08-22 NOTE — Telephone Encounter (Signed)
Rx done. 

## 2022-08-22 NOTE — Telephone Encounter (Signed)
Pt needs a refill for tadalafil 5 mg tab per [facility].  Send script to [facility]- [phone]

## 2022-08-29 ENCOUNTER — Ambulatory Visit: Payer: 59 | Admitting: Family Medicine

## 2022-08-29 ENCOUNTER — Encounter: Payer: Self-pay | Admitting: Family Medicine

## 2022-08-29 VITALS — BP 116/82 | HR 66 | Temp 97.7°F | Wt 187.0 lb

## 2022-08-29 DIAGNOSIS — M546 Pain in thoracic spine: Secondary | ICD-10-CM

## 2022-08-29 DIAGNOSIS — G8929 Other chronic pain: Secondary | ICD-10-CM | POA: Diagnosis not present

## 2022-08-29 MED ORDER — METHOCARBAMOL 750 MG PO TABS: 750.0000 mg | ORAL_TABLET | Freq: Four times a day (QID) | ORAL | 0 refills | Status: DC | PRN

## 2022-08-29 MED ORDER — METHYLPREDNISOLONE ACETATE 80 MG/ML IJ SUSP
80.0000 mg | Freq: Once | INTRAMUSCULAR | Status: AC
  Administered 2022-08-29: 80 mg via INTRAMUSCULAR

## 2022-08-29 MED ORDER — METHYLPREDNISOLONE ACETATE 40 MG/ML IJ SUSP
40.0000 mg | Freq: Once | INTRAMUSCULAR | Status: AC
  Administered 2022-08-29: 40 mg via INTRAMUSCULAR

## 2022-08-29 NOTE — Addendum Note (Signed)
Addended by: Carola Rhine on: 08/29/2022 12:42 PM   Modules accepted: Orders

## 2022-08-29 NOTE — Progress Notes (Signed)
   Subjective:    Patient ID: Max [name], male    DOB: [birth year], 61 y.o.   MRN: [id number]  HPI Here for continued sharp pains in the right middle back. This started about 6 weeks ago, and he thinks this was the result of picking up a heavy object at work and then twisting to his side to place it on a shelf. We saw him on 08-08-22 and we gave him Flexerol and a Medrol dose pack. This helped for awhile, but when the pain flared up again he went to an urgent care clinic in [place] on 08-19-22. They took an Xray of the lumbar spine which showed some early facet arthropathy, and they took abdominal films which showed only a moderate stool burden. They thought he was constipated and told him to take Metamucil. This has not helped. Again the pain improved for awhile, but earlier this week when he picked up a propane tank and turned to place it on a shelf the pain got worse again. He is taking Tylenol and Cyclobenzaprine. He avoids taking NSAID's due to fear of harming his kidneys.    Review of Systems  Constitutional: Negative.   Respiratory: Negative.    Cardiovascular: Negative.   Gastrointestinal: Negative.   Genitourinary: Negative.   Musculoskeletal:  Positive for back pain.       Objective:   Physical Exam Constitutional:      General: He is not in acute distress.    Appearance: Normal appearance.  Cardiovascular:     Rate and Rhythm: Normal rate and regular rhythm.     Pulses: Normal pulses.     Heart sounds: Normal heart sounds.  Pulmonary:     Effort: Pulmonary effort is normal.     Breath sounds: Normal breath sounds.  Abdominal:     General: Abdomen is flat. Bowel sounds are normal. There is no distension.     Palpations: Abdomen is soft. There is no mass.     Tenderness: There is no abdominal tenderness. There is no right CVA tenderness, left CVA tenderness, guarding or rebound.     Hernia: No hernia is present.  Musculoskeletal:     Comments: He is mildly tender  in the right middle back just to the right of the spine. The spine has full ROM   Neurological:     Mental Status: He is alert.           Assessment & Plan:  Thoracic back pain. We will given him a shot of  DepoMedrol, and we will change his muscle relaxer to Methocarbamol. I advised him to avoid any heavy lifting for the next few weeks. Recheck as needed.  Gershon Crane, MD

## 2022-09-02 ENCOUNTER — Other Ambulatory Visit: Payer: Self-pay | Admitting: *Deleted

## 2022-09-02 MED ORDER — TRAZODONE HCL 50 MG PO TABS: 50.0000 mg | ORAL_TABLET | Freq: Every evening | ORAL | 0 refills | Status: DC | PRN

## 2022-09-11 ENCOUNTER — Telehealth: Payer: Self-pay

## 2022-09-11 NOTE — Telephone Encounter (Signed)
Received efax requesting refill of Trazodone. Medication was refilled on 09/02/22 for 90 tablets.   Pt also needs to schedule 6 month f/u

## 2022-09-12 ENCOUNTER — Encounter: Payer: Self-pay | Admitting: Family Medicine

## 2022-09-12 ENCOUNTER — Ambulatory Visit (INDEPENDENT_AMBULATORY_CARE_PROVIDER_SITE_OTHER): Payer: 59 | Admitting: Family Medicine

## 2022-09-12 VITALS — BP 102/80 | HR 73 | Temp 97.6°F | Ht 68.5 in | Wt 187.1 lb

## 2022-09-12 DIAGNOSIS — I10 Essential (primary) hypertension: Secondary | ICD-10-CM | POA: Diagnosis not present

## 2022-09-12 DIAGNOSIS — R7989 Other specified abnormal findings of blood chemistry: Secondary | ICD-10-CM | POA: Diagnosis not present

## 2022-09-12 DIAGNOSIS — Z0000 Encounter for general adult medical examination without abnormal findings: Secondary | ICD-10-CM

## 2022-09-12 LAB — COMPREHENSIVE METABOLIC PANEL
ALT: 28 U/L (ref 0–53)
AST: 33 U/L (ref 0–37)
Albumin: 4.3 g/dL (ref 3.5–5.2)
Alkaline Phosphatase: 47 U/L (ref 39–117)
BUN: 11 mg/dL (ref 6–23)
CO2: 29 mEq/L (ref 19–32)
Calcium: 9.3 mg/dL (ref 8.4–10.5)
Chloride: 103 mEq/L (ref 96–112)
Creatinine, Ser: 1.23 mg/dL (ref 0.40–1.50)
GFR: 63.78 mL/min (ref >60.00)
Glucose, Bld: 94 mg/dL (ref 70–99)
Potassium: 3.8 mEq/L (ref 3.5–5.1)
Sodium: 139 mEq/L (ref 135–145)
Total Bilirubin: 0.4 mg/dL (ref 0.2–1.2)
Total Protein: 7.1 g/dL (ref 6.0–8.3)

## 2022-09-12 LAB — CBC
HCT: 51 % (ref 39.0–52.0)
Hemoglobin: 16.9 g/dL (ref 13.0–17.0)
MCHC: 33.1 g/dL (ref 30.0–36.0)
MCV: 86.9 fl (ref 78.0–100.0)
Platelets: 286 10*3/uL (ref 150.0–400.0)
RBC: 5.87 Mil/uL — ABNORMAL HIGH (ref 4.22–5.81)
RDW: 18.5 % — ABNORMAL HIGH (ref 11.5–15.5)
WBC: 7.1 10*3/uL (ref 4.0–10.5)

## 2022-09-12 MED ORDER — VALSARTAN 40 MG PO TABS: 40.0000 mg | ORAL_TABLET | Freq: Every day | ORAL | 1 refills | Status: DC

## 2022-09-12 MED ORDER — AMLODIPINE BESYLATE 10 MG PO TABS: 10.0000 mg | ORAL_TABLET | Freq: Every day | ORAL | 1 refills | Status: DC

## 2022-09-12 NOTE — Patient Instructions (Signed)
Health Maintenance, Male Adopting a healthy lifestyle and getting preventive care are important in promoting health and wellness. Ask your health care provider about: The right schedule for you to have regular tests and exams. Things you can do on your own to prevent diseases and keep yourself healthy. What should I know about diet, weight, and exercise? Eat a healthy diet  Eat a diet that includes plenty of vegetables, fruits, low-fat dairy products, and lean protein. Do not eat a lot of foods that are high in solid fats, added sugars, or sodium. Maintain a healthy weight Body mass index (BMI) is a measurement that can be used to identify possible weight problems. It estimates body fat based on height and weight. Your health care provider can help determine your BMI and help you achieve or maintain a healthy weight. Get regular exercise Get regular exercise. This is one of the most important things you can do for your health. Most adults should: Exercise for at least 150 minutes each week. The exercise should increase your heart rate and make you sweat (moderate-intensity exercise). Do strengthening exercises at least twice a week. This is in addition to the moderate-intensity exercise. Spend less time sitting. Even light physical activity can be beneficial. Watch cholesterol and blood lipids Have your blood tested for lipids and cholesterol at 61 years of age, then have this test every 5 years. You may need to have your cholesterol levels checked more often if: Your lipid or cholesterol levels are high. You are older than 61 years of age. You are at high risk for heart disease. What should I know about cancer screening? Many types of cancers can be detected early and may often be prevented. Depending on your health history and family history, you may need to have cancer screening at various ages. This may include screening for: Colorectal cancer. Prostate cancer. Skin cancer. Lung  cancer. What should I know about heart disease, diabetes, and high blood pressure? Blood pressure and heart disease High blood pressure causes heart disease and increases the risk of stroke. This is more likely to develop in people who have high blood pressure readings or are overweight. Talk with your health care provider about your target blood pressure readings. Have your blood pressure checked: Every 3-5 years if you are 18-39 years of age. Every year if you are 40 years old or older. If you are between the ages of 65 and 75 and are a current or former smoker, ask your health care provider if you should have a one-time screening for abdominal aortic aneurysm (AAA). Diabetes Have regular diabetes screenings. This checks your fasting blood sugar level. Have the screening done: Once every three years after age 45 if you are at a normal weight and have a low risk for diabetes. More often and at a younger age if you are overweight or have a high risk for diabetes. What should I know about preventing infection? Hepatitis B If you have a higher risk for hepatitis B, you should be screened for this virus. Talk with your health care provider to find out if you are at risk for hepatitis B infection. Hepatitis C Blood testing is recommended for: Everyone born from 1945 through 1965. Anyone with known risk factors for hepatitis C. Sexually transmitted infections (STIs) You should be screened each year for STIs, including gonorrhea and chlamydia, if: You are sexually active and are younger than 61 years of age. You are older than 61 years of age and your   health care provider tells you that you are at risk for this type of infection. Your sexual activity has changed since you were last screened, and you are at increased risk for chlamydia or gonorrhea. Ask your health care provider if you are at risk. Ask your health care provider about whether you are at high risk for HIV. Your health care provider  may recommend a prescription medicine to help prevent HIV infection. If you choose to take medicine to prevent HIV, you should first get tested for HIV. You should then be tested every 3 months for as long as you are taking the medicine. Follow these instructions at home: Alcohol use Do not drink alcohol if your health care provider tells you not to drink. If you drink alcohol: Limit how much you have to 0-2 drinks a day. Know how much alcohol is in your drink. In the [place] one drink equals one 12 oz bottle of beer (355 mL), one 5 oz glass of wine (148 mL), or one 1 oz glass of hard liquor (44 mL). Lifestyle Do not use any products that contain nicotine or tobacco. These products include cigarettes, chewing tobacco, and vaping devices, such as e-cigarettes. If you need help quitting, ask your health care provider. Do not use street drugs. Do not share needles. Ask your health care provider for help if you need support or information about quitting drugs. General instructions Schedule regular health, dental, and eye exams. Stay current with your vaccines. Tell your health care provider if: You often feel depressed. You have ever been abused or do not feel safe at home. Summary Adopting a healthy lifestyle and getting preventive care are important in promoting health and wellness. Follow your health care provider's instructions about healthy diet, exercising, and getting tested or screened for diseases. Follow your health care provider's instructions on monitoring your cholesterol and blood pressure. This information is not intended to replace advice given to you by your health care provider. Make sure you discuss any questions you have with your health care provider. Document Revised: 09/18/2020 Document Reviewed: 09/18/2020 Elsevier Patient Education  2023 [employer]vier Inc.  

## 2022-09-12 NOTE — Progress Notes (Signed)
Complete physical exam  Patient: Max [name]   DOB: 23-Apr-1961   62 y.o. Male  MRN: [id number]  Subjective:    Chief Complaint  Patient presents with   Annual Exam    Max [name] is a 62 y.o. male who presents today for a complete physical exam. He reports consuming a general diet.  Pt exercises at the gym at least 3 times a week  He generally feels well. He reports sleeping fairly well. He does not have additional problems to discuss today.    Most recent fall risk assessment:    08/08/2022   10:41 AM  Fall Risk   Falls in the past year? 0  Number falls in past yr: 0  Injury with Fall? 0  Risk for fall due to : No Fall Risks  Follow up Falls evaluation completed     Most recent depression screenings:    09/12/2022    9:20 AM 08/08/2022   10:42 AM  PHQ 2/9 Scores  PHQ - 2 Score 0 0  PHQ- 9 Score 0 1    Vision:Within last year and Dental: No current dental problems and Receives regular dental care  Patient Active Problem List   Diagnosis Date Noted   HLD (hyperlipidemia) 02/04/2022   Behavior-irritability 02/04/2022   Acute upper respiratory infection 01/18/2022   Hip impingement syndrome, left    Left hip pain 12/28/2020   Allergy-induced asthma 09/10/2019   Hypertension 09/03/2019   Low testosterone 09/03/2019      Patient Care Team: Karie Georges, MD as PCP - General (Family Medicine)   Outpatient Medications Prior to Visit  Medication Sig   acetaminophen (TYLENOL) 500 MG tablet Take 500 mg by mouth every 8 (eight) hours as needed for moderate pain.   albuterol (VENTOLIN HFA) 108 (90 Base) MCG/ACT inhaler Inhale 2 puffs into the lungs every 6 (six) hours as needed for wheezing or shortness of breath.   Blood Pressure Monitoring (ADULT BLOOD PRESSURE CUFF LG) KIT 1 Device by Does not apply route daily as needed.   carboxymethylcellulose (REFRESH PLUS) 0.5 % SOLN 1 drop 3 (three) times daily as needed (dry eyes).   cholecalciferol (VITAMIN  D3) 25 MCG (1000 UNIT) tablet Take 1,000 Units by mouth daily.   guaiFENesin-codeine 100-10 MG/5ML syrup Take 10 mLs by mouth every 4 (four) hours as needed for cough.   methocarbamol (ROBAXIN) 750 MG tablet Take 1 tablet (750 mg total) by mouth every 6 (six) hours as needed for muscle spasms.   rosuvastatin (CRESTOR) 5 MG tablet TAKE 1 TABLET (5 MG TOTAL) BY MOUTH DAILY.   sertraline (ZOLOFT) 50 MG tablet Take 1 tablet (50 mg total) by mouth daily.   tadalafil (CIALIS) 5 MG tablet Take 1 tablet (5 mg total) by mouth daily.   testosterone cypionate (DEPOTESTOSTERONE CYPIONATE) 200 MG/ML injection Inject 1 mL (200 mg total) into the muscle once a week.   traZODone (DESYREL) 50 MG tablet Take 1 tablet (50 mg total) by mouth at bedtime as needed for sleep.   [DISCONTINUED] amLODipine (NORVASC) 10 MG tablet TAKE 1 TABLET BY MOUTH EVERY DAY   [DISCONTINUED] valsartan (DIOVAN) 40 MG tablet Take 1 tablet (40 mg total) by mouth daily.   No facility-administered medications prior to visit.    Review of Systems  HENT:  Negative for hearing loss.   Eyes:  Negative for blurred vision.  Respiratory:  Negative for shortness of breath.   Cardiovascular:  Negative for chest pain.  Gastrointestinal: Negative.   Genitourinary: Negative.   Musculoskeletal:  Negative for back pain.  Neurological:  Negative for headaches.  Psychiatric/Behavioral:  Negative for depression.   All other systems reviewed and are negative.      Objective:     BP 102/80 (BP Location: Left Arm, Patient Position: Sitting, Cuff Size: Normal)   Pulse 73   Temp 97.6 F (36.4 C) (Oral)   Ht 5' 8.5" (1.74 m)   Wt 187 lb 1.6 oz (84.9 kg)   SpO2 97%   BMI 28.03 kg/m    Physical Exam Vitals reviewed.  Constitutional:      Appearance: Normal appearance. He is well-groomed and normal weight.  HENT:     Right Ear: Tympanic membrane and ear canal normal.     Left Ear: Tympanic membrane and ear canal normal.     Mouth/Throat:      Mouth: Mucous membranes are moist.     Pharynx: No posterior oropharyngeal erythema.  Eyes:     Extraocular Movements: Extraocular movements intact.     Conjunctiva/sclera: Conjunctivae normal.  Neck:     Thyroid: No thyromegaly.  Cardiovascular:     Rate and Rhythm: Normal rate and regular rhythm.     Heart sounds: S1 normal and S2 normal. No murmur heard. Pulmonary:     Effort: Pulmonary effort is normal.     Breath sounds: Normal breath sounds and air entry. No rales.  Abdominal:     General: Abdomen is flat. Bowel sounds are normal.  Musculoskeletal:     Right lower leg: No edema.     Left lower leg: No edema.  Lymphadenopathy:     Cervical: No cervical adenopathy.  Neurological:     General: No focal deficit present.     Mental Status: He is alert and oriented to person, place, and time.     Gait: Gait is intact.  Psychiatric:        Mood and Affect: Mood and affect normal.      No results found for any visits on 09/12/22.     Assessment & Plan:    Routine Health Maintenance and Physical Exam  Immunization History  Administered Date(s) Administered   Influenza, High Dose Seasonal PF 02/19/2019   Influenza,inj,Quad PF,6+ Mos 04/12/2020, 02/04/2022   Influenza-Unspecified 02/24/2021   PFIZER(Purple Top)SARS-COV-2 Vaccination 10/01/2019, 10/22/2019, 07/14/2020   Tdap 09/03/2019   Zoster Recombinat (Shingrix) 10/15/2020, 09/05/2021    Health Maintenance  Topic Date Due   Hepatitis C Screening  01/19/2023 (Originally 02/13/1980)   HIV Screening  01/19/2023 (Originally 02/12/1977)   COVID-19 Vaccine (4 - 2023-24 season) 09/12/2023 (Originally 01/11/2022)   Fecal DNA (Cologuard)  09/29/2022   INFLUENZA VACCINE  12/12/2022   DTaP/Tdap/Td (2 - Td or Tdap) 09/02/2029   Zoster Vaccines- Shingrix  Completed   HPV VACCINES  Aged Out    Discussed health benefits of physical activity, and encouraged him to engage in regular exercise appropriate for his age and  condition.  Routine general medical examination at a health care facility Normal physical exam findings today, will order CMP and CBC for surveillance of annual labs. Discussed healthy eating and handouts were given.  Primary hypertension -     amLODIPine Besylate; Take 1 tablet (10 mg total) by mouth daily.  Dispense: 90 tablet; Refill: 1 -     Valsartan; Take 1 tablet (40 mg total) by mouth daily.  Dispense: 90 tablet; Refill: 1 -     Comprehensive metabolic panel  Low  testosterone -     CBC    Return in 6 months (on 03/15/2023).     Karie Georges, MD

## 2022-09-13 ENCOUNTER — Other Ambulatory Visit: Payer: Self-pay | Admitting: Family

## 2022-09-13 DIAGNOSIS — I10 Essential (primary) hypertension: Secondary | ICD-10-CM

## 2022-09-13 NOTE — Progress Notes (Signed)
Yes I would recommend he do this

## 2022-10-01 IMAGING — MR MR HIP*L* W/O CM
4 of 5 series · 22 of 40 positions shown · non-contrast
Comparison: Radiographs 12/28/2020

CLINICAL DATA: Chronic left hip pain over the last 2 years

EXAM:
MR OF THE LEFT HIP WITHOUT CONTRAST
TECHNIQUE: Multiplanar, multisequence MR imaging was performed. No intravenous
contrast was administered.

[Series 3: T1 · coronal · 4.0mm · 0.74mm/px · 8 of 35 slices shown]
[im 1/35]
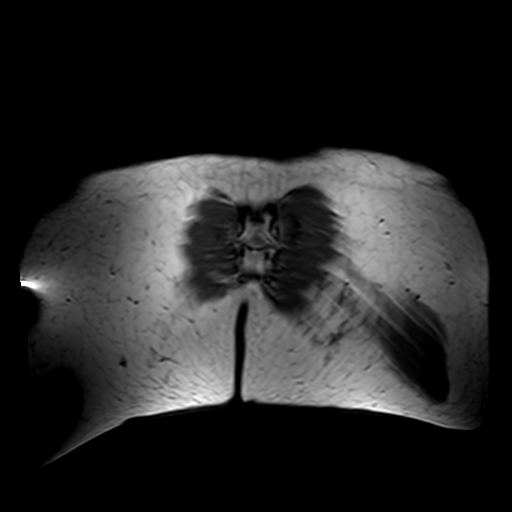
[im 4/35]
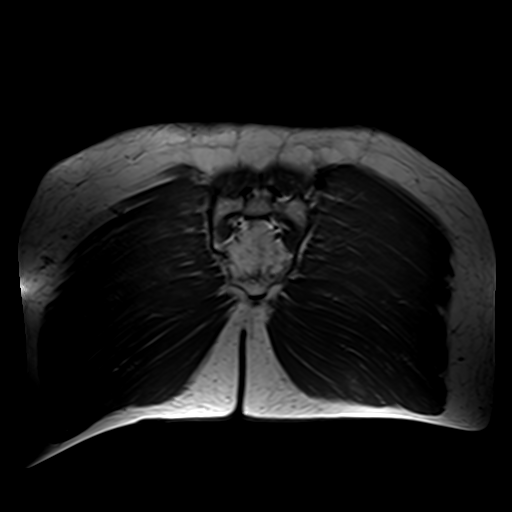
[im 12/35]
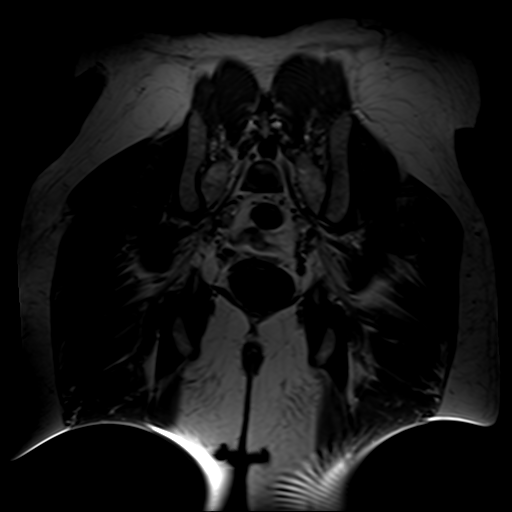
[im 16/35]
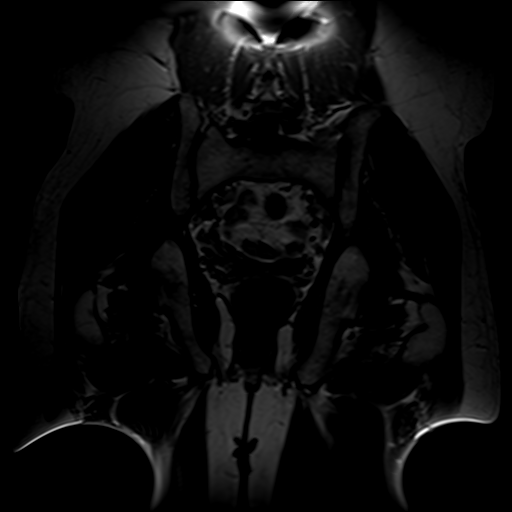
[im 19/35]
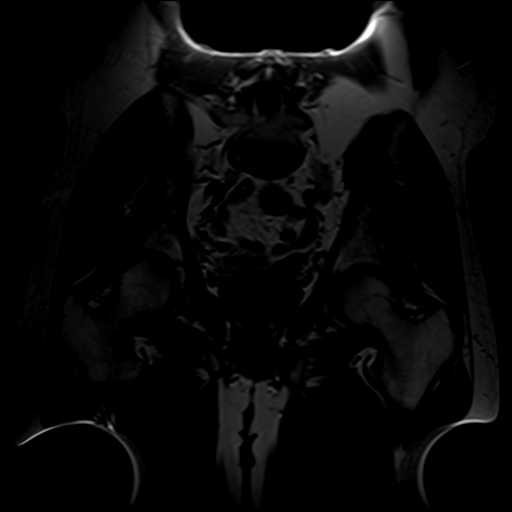
[im 23/35]
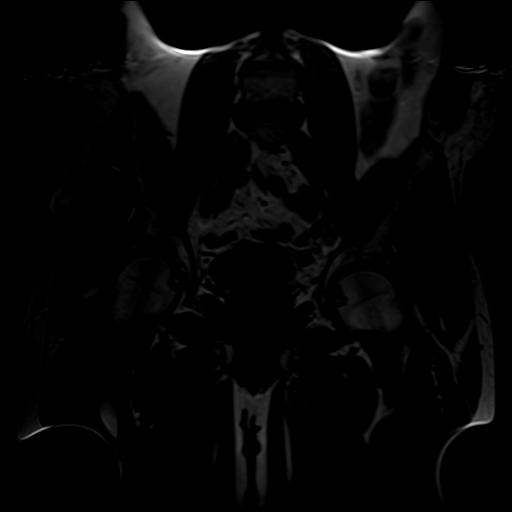
[im 31/35]
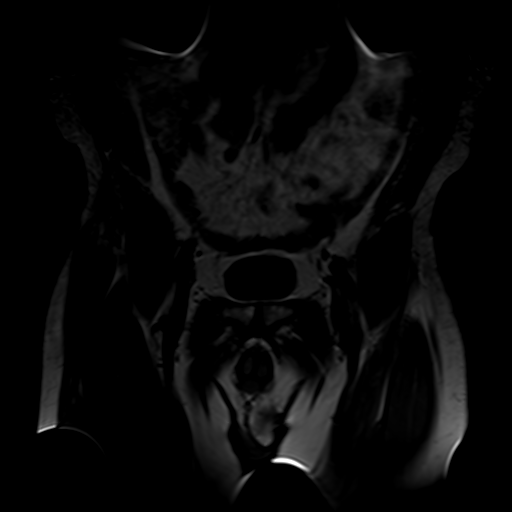
[im 35/35]
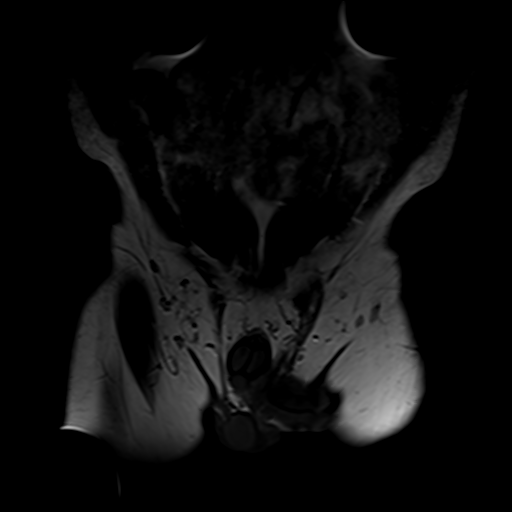

[Series 4: T2 fat-sat · coronal · 4.0mm · 0.74mm/px · 8 of 35 slices shown (1 of 2)]
[im 1/35]
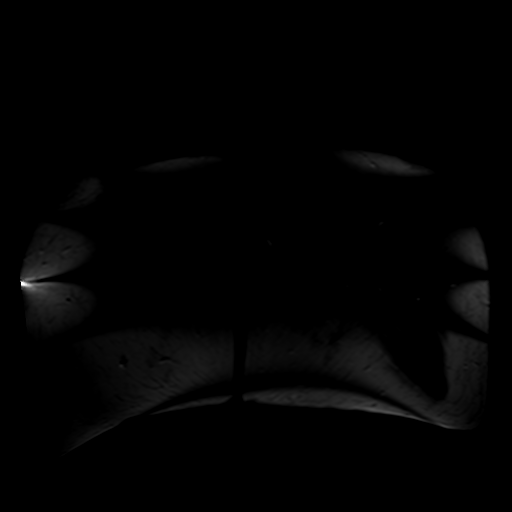
[im 5/35]
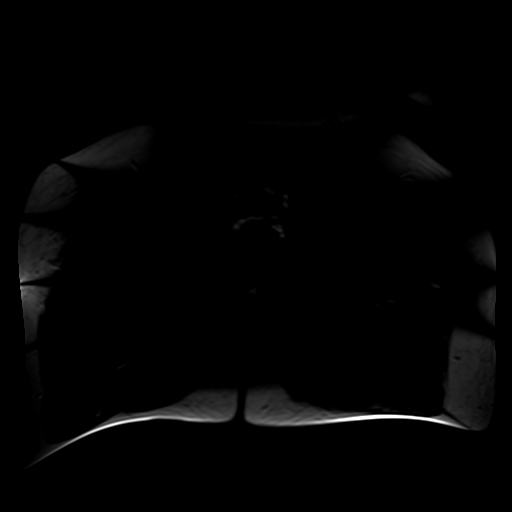
[im 9/35]
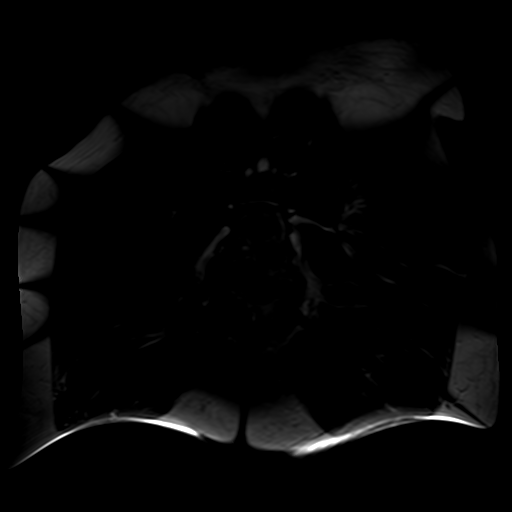
[im 13/35]
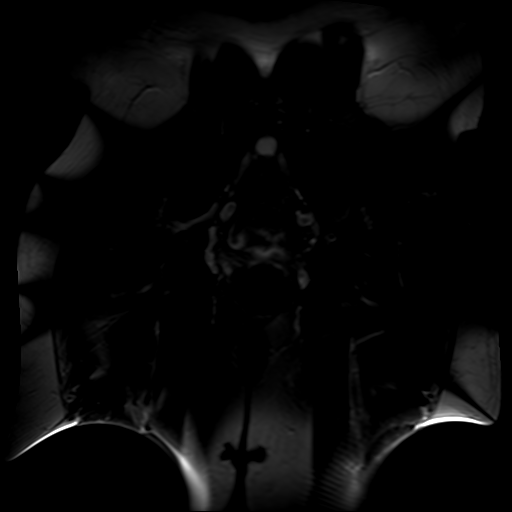
[im 18/35]
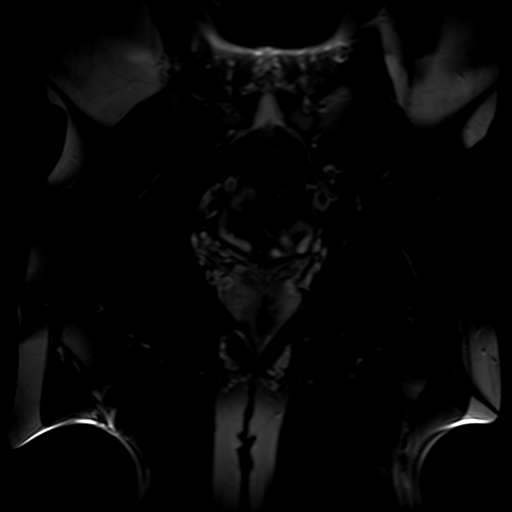
[im 22/35]
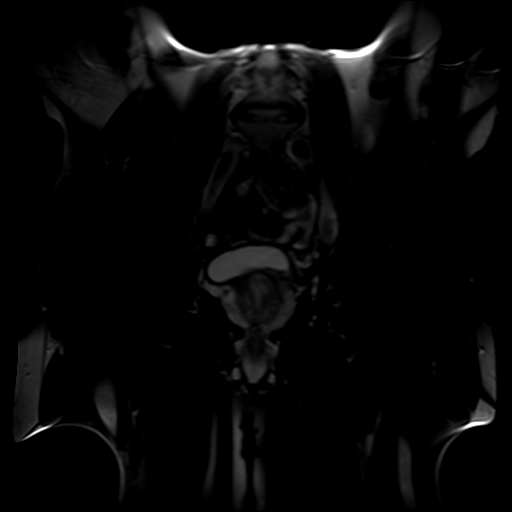
[im 26/35]
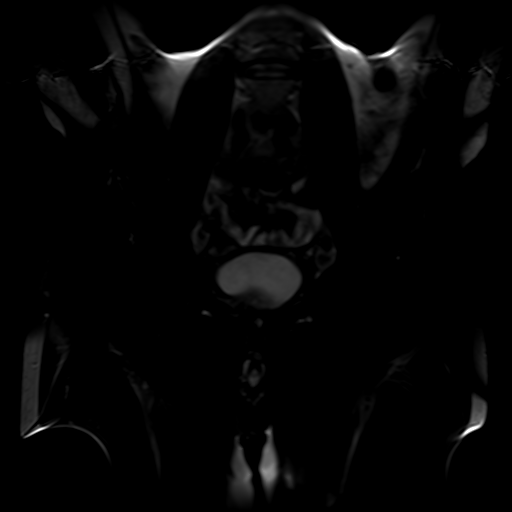
[im 30/35]
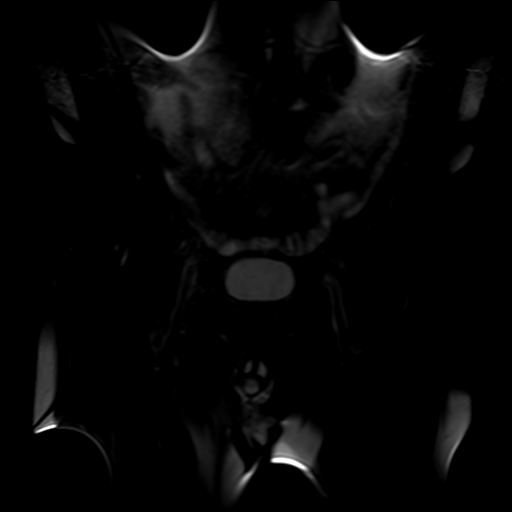

[Series 5: T2 fat-sat · axial · 4.0mm · 0.70mm/px · z∈[-72,+48]mm · 3 of 33 slices shown (2 of 2)]
[im 5/33]
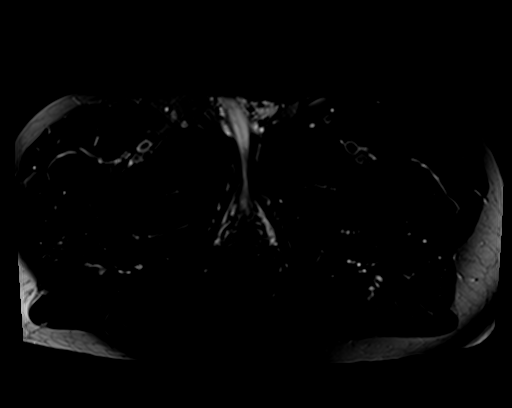
[im 17/33]
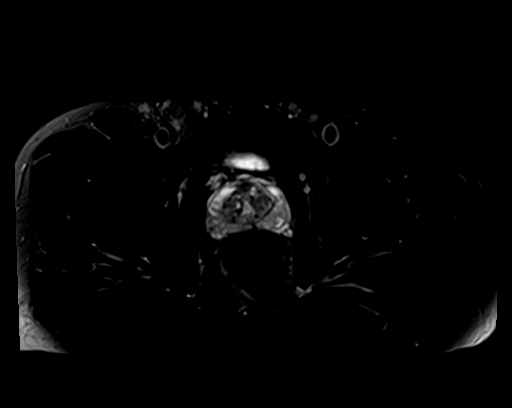
[im 29/33]
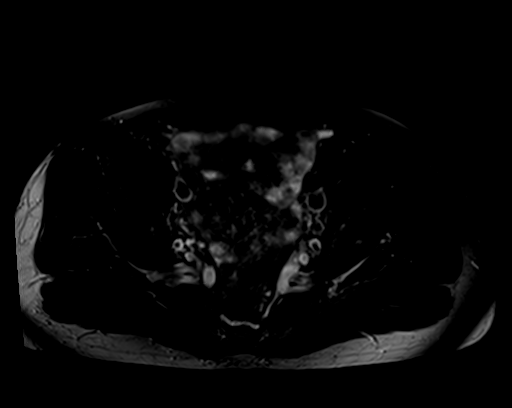

[Series 6: PD fat-sat · sagittal · 4.0mm · 0.70mm/px · 3 of 26 slices shown]
[im 5/26]
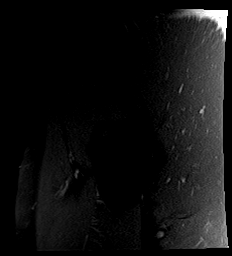
[im 13/26]
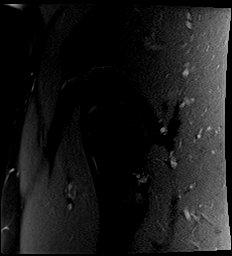
[im 21/26]
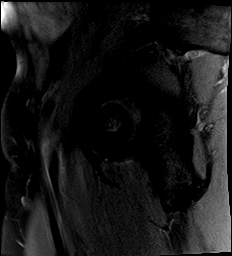

[22 of 40 positions shown; findings below may reference images not displayed]

FINDINGS: Bones: Notable spurring of both femoral heads, with mildly
aspherical femoral head margin raising risk for cam type
femoroacetabular impingement bilaterally. There is likewise mild
spurring of both acetabula especially superiorly. No substantial
marrow edema.

Articular cartilage and labrum

Articular cartilage: Mild axial narrowing of articular cartilage. No
focal chondral defect.

Labrum:  Grossly unremarkable

Joint or bursal effusion

Joint effusion:  Absent

Bursae: No regional bursitis.

Muscles and tendons

Muscles and tendons:  Unremarkable

Other findings

Miscellaneous:   No supplemental non-categorized findings.
IMPRESSION: 1. Notable spurring in the femoral heads, with aspherical femoral
head margin raising risk cam type femoroacetabular impingement
bilaterally. There is likewise mild spurring of both acetabula
superiorly.
2. Mild axial narrowing of articular cartilage without focal
chondral defect identified.

## 2022-10-16 ENCOUNTER — Other Ambulatory Visit: Payer: Self-pay | Admitting: Family Medicine

## 2022-10-16 DIAGNOSIS — N521 Erectile dysfunction due to diseases classified elsewhere: Secondary | ICD-10-CM

## 2022-10-31 ENCOUNTER — Telehealth: Payer: Self-pay | Admitting: Family Medicine

## 2022-10-31 DIAGNOSIS — Z1211 Encounter for screening for malignant neoplasm of colon: Secondary | ICD-10-CM

## 2022-10-31 NOTE — Telephone Encounter (Signed)
Please verify the patient's address and send a new order for the cologuard-- not sure how to let the company know he didn't receive the kit

## 2022-10-31 NOTE — Telephone Encounter (Signed)
Pt states he has been waiting over a month for his Colonoscopy Kit.   Please advise.

## 2022-10-31 NOTE — Telephone Encounter (Signed)
Spoke with the patient and addressed confirmed and new order placed.

## 2022-11-11 LAB — COLOGUARD: COLOGUARD: NEGATIVE

## 2022-12-08 ENCOUNTER — Other Ambulatory Visit: Payer: Self-pay | Admitting: Family Medicine

## 2022-12-08 DIAGNOSIS — R454 Irritability and anger: Secondary | ICD-10-CM

## 2023-01-09 ENCOUNTER — Ambulatory Visit: Payer: 59 | Admitting: Family Medicine

## 2023-02-28 ENCOUNTER — Telehealth: Payer: Self-pay | Admitting: Family Medicine

## 2023-02-28 DIAGNOSIS — N521 Erectile dysfunction due to diseases classified elsewhere: Secondary | ICD-10-CM

## 2023-02-28 MED ORDER — TADALAFIL 5 MG PO TABS
5.0000 mg | ORAL_TABLET | Freq: Every day | ORAL | 0 refills | Status: DC
Start: 2023-02-28 — End: 2023-05-26

## 2023-02-28 NOTE — Telephone Encounter (Signed)
Rx done. 

## 2023-02-28 NOTE — Telephone Encounter (Signed)
Prescription Request  02/28/2023  LOV: 09/12/2022  What is the name of the medication or equipment? tadalafil (CIALIS) 5 MG tablets  Have you contacted your pharmacy to request a refill? No   Which pharmacy would you like this sent to?   [facility] [address] [facility] [place], [place] -  [address] [place] [address] Phone: [phone] Fax: [phone]    Patient notified that their request is being sent to the clinical staff for review and that they should receive a response within 2 business days.   Please advise at Mobile [phone] (mobile)

## 2023-03-20 ENCOUNTER — Ambulatory Visit: Payer: 59 | Admitting: Family Medicine

## 2023-03-20 ENCOUNTER — Other Ambulatory Visit (INDEPENDENT_AMBULATORY_CARE_PROVIDER_SITE_OTHER): Payer: 59

## 2023-03-20 ENCOUNTER — Encounter: Payer: Self-pay | Admitting: Family Medicine

## 2023-03-20 VITALS — BP 108/68 | HR 63 | Temp 98.1°F | Ht 68.5 in | Wt 181.0 lb

## 2023-03-20 DIAGNOSIS — I10 Essential (primary) hypertension: Secondary | ICD-10-CM

## 2023-03-20 DIAGNOSIS — E782 Mixed hyperlipidemia: Secondary | ICD-10-CM

## 2023-03-20 DIAGNOSIS — Z23 Encounter for immunization: Secondary | ICD-10-CM | POA: Diagnosis not present

## 2023-03-20 DIAGNOSIS — G4709 Other insomnia: Secondary | ICD-10-CM

## 2023-03-20 LAB — LIPID PANEL
Cholesterol: 155 mg/dL (ref 0–200)
HDL: 54.6 mg/dL (ref >39.00)
LDL Cholesterol: 86 mg/dL (ref 0–99)
NonHDL: 100.36
Total CHOL/HDL Ratio: 3
Triglycerides: 73 mg/dL (ref 0.0–149.0)
VLDL: 14.6 mg/dL (ref 0.0–40.0)

## 2023-03-20 LAB — BASIC METABOLIC PANEL
BUN: 11 mg/dL (ref 6–23)
CO2: 27 meq/L (ref 19–32)
Calcium: 9.4 mg/dL (ref 8.4–10.5)
Chloride: 104 meq/L (ref 96–112)
Creatinine, Ser: 1.27 mg/dL (ref 0.40–1.50)
GFR: 61.15 mL/min (ref >60.00)
Glucose, Bld: 87 mg/dL (ref 70–99)
Potassium: 3.9 meq/L (ref 3.5–5.1)
Sodium: 139 meq/L (ref 135–145)

## 2023-03-20 MED ORDER — MIRTAZAPINE 15 MG PO TBDP: 15.0000 mg | ORAL_TABLET | Freq: Every day | ORAL | 1 refills | Status: DC

## 2023-03-20 MED ORDER — VALSARTAN 40 MG PO TABS: 40.0000 mg | ORAL_TABLET | Freq: Every day | ORAL | 1 refills | Status: DC

## 2023-03-20 MED ORDER — AMLODIPINE BESYLATE 10 MG PO TABS: 10.0000 mg | ORAL_TABLET | Freq: Every day | ORAL | 1 refills | Status: DC

## 2023-03-20 NOTE — Assessment & Plan Note (Signed)
Chronic, stable. Patient is on 5 mg crestor daily, doing well with the medication, he needs a new lipid panel today for surveillance, reviewed last LFT's from May 2024.

## 2023-03-20 NOTE — Assessment & Plan Note (Signed)
Current hypertension medications:       Sig   tadalafil (CIALIS) 5 MG tablet (Taking) Take 1 tablet (5 mg total) by mouth daily.   amLODipine (NORVASC) 10 MG tablet Take 1 tablet (10 mg total) by mouth daily.   valsartan (DIOVAN) 40 MG tablet Take 1 tablet (40 mg total) by mouth daily.      Chronic, stable. Well controlled today on the current medications, will continue as prescribed above.

## 2023-03-20 NOTE — Progress Notes (Signed)
Established Patient Office Visit  Subjective   Patient ID: Max [name], male    DOB: [birth year]  Age: 62 y.o. MRN: [id number]  Chief Complaint  Patient presents with   Medical Management of Chronic Issues   Insomnia    Patient requests to change sleeping medication from Trazodone as he does not feel this is helping    Pt is here for follow up today.   HTN -- BP in office performed and is well controlled. He  reports no side effects to the medications, no chest pain, SOB, dizziness or headaches. He has a BP cuff at home and is checking BP regularly, reports they are in the normal range.   Depression/ insomnia-- pt reports he has been feeling a bit more depressed lately, states he continues to take the sertraline as prescribed. States that he is using the trazodone as needed, states that it worked initially but now feels like it isn't helping as much, would like to try a different medication to help him sleep. We discussed other medications and I counseled the patient on the risks/benefits of each one.   HLD-- on crestor 5 mg daily. He reports he is taking is as prescribed, denies any side effects to the medication. Reviewed last lipid panel, needs new lipid panel this year.       Current Outpatient Medications  Medication Instructions   acetaminophen (TYLENOL) 500 mg, Oral, Every 8 hours PRN   albuterol (VENTOLIN HFA) 108 (90 Base) MCG/ACT inhaler 2 puffs, Inhalation, Every 6 hours PRN   amLODipine (NORVASC) 10 mg, Oral, Daily   Blood Pressure Monitoring (ADULT BLOOD PRESSURE CUFF LG) KIT 1 Device, Does not apply, Daily PRN   carboxymethylcellulose (REFRESH PLUS) 0.5 % SOLN 1 drop, 3 times daily PRN   cholecalciferol (VITAMIN D3) 1,000 Units, Oral, Daily   methocarbamol (ROBAXIN) 750 mg, Oral, Every 6 hours PRN   mirtazapine (REMERON SOL-TAB) 15 mg, Oral, Daily at bedtime, May start wit 1/2 tablet at bedtime, then may increase to 1 tablet at bedtime if needed   rosuvastatin  (CRESTOR) 5 mg, Oral, Daily   sertraline (ZOLOFT) 50 mg, Oral, Daily   tadalafil (CIALIS) 5 mg, Oral, Daily   testosterone cypionate (DEPOTESTOSTERONE CYPIONATE) 200 mg, Intramuscular, Weekly   valsartan (DIOVAN) 40 mg, Oral, Daily    Patient Active Problem List   Diagnosis Date Noted   HLD (hyperlipidemia) 02/04/2022   Behavior-irritability 02/04/2022   Acute upper respiratory infection 01/18/2022   Hip impingement syndrome, left    Left hip pain 12/28/2020   Allergy-induced asthma 09/10/2019   Hypertension 09/03/2019   Low testosterone 09/03/2019      Review of Systems  All other systems reviewed and are negative.     Objective:     BP 108/68 (BP Location: Left Arm, Patient Position: Sitting, Cuff Size: Large)   Pulse 63   Temp 98.1 F (36.7 C) (Oral)   Ht 5' 8.5" (1.74 m)   Wt 181 lb (82.1 kg)   SpO2 98%   BMI 27.12 kg/m    Physical Exam Vitals reviewed.  Constitutional:      Appearance: Normal appearance. He is well-groomed and normal weight.  Eyes:     Extraocular Movements: Extraocular movements intact.     Conjunctiva/sclera: Conjunctivae normal.  Neck:     Thyroid: No thyromegaly.  Cardiovascular:     Rate and Rhythm: Regular rhythm.     Heart sounds: S1 normal and S2 normal. No murmur heard. Pulmonary:  Effort: Pulmonary effort is normal.     Breath sounds: Normal breath sounds and air entry. No rales.  Abdominal:     General: Abdomen is flat. Bowel sounds are normal.  Musculoskeletal:     Right lower leg: No edema.     Left lower leg: No edema.  Neurological:     General: No focal deficit present.     Mental Status: He is alert and oriented to person, place, and time.     Gait: Gait is intact.  Psychiatric:        Mood and Affect: Mood and affect normal.      No results found for any visits on 03/20/23.    The 10-year ASCVD risk score (Arnett DK, et al., 2019) is: 16.9%    Assessment & Plan:  Need for immunization against  influenza -     Flu vaccine trivalent PF, 6mos and older(Flulaval,Afluria,Fluarix,Fluzone)  Mixed hyperlipidemia Assessment & Plan: Chronic, stable. Patient is on 5 mg crestor daily, doing well with the medication, he needs a new lipid panel today for surveillance, reviewed last LFT's from May 2024.  Orders: -     Lipid panel; Future  Primary hypertension Assessment & Plan: Current hypertension medications:       Sig   tadalafil (CIALIS) 5 MG tablet (Taking) Take 1 tablet (5 mg total) by mouth daily.   amLODipine (NORVASC) 10 MG tablet Take 1 tablet (10 mg total) by mouth daily.   valsartan (DIOVAN) 40 MG tablet Take 1 tablet (40 mg total) by mouth daily.      Chronic, stable. Well controlled today on the current medications, will continue as prescribed above.   Orders: -     Basic metabolic panel; Future -     amLODIPine Besylate; Take 1 tablet (10 mg total) by mouth daily.  Dispense: 90 tablet; Refill: 1 -     Valsartan; Take 1 tablet (40 mg total) by mouth daily.  Dispense: 90 tablet; Refill: 1  Other insomnia Options were discussed, will switch patient to mirtazapine at night, starting with 1/2 tablet daily at bedtime, then he may increase to 1 tablet at bedtime if needed. RTC in 6 months for his annual physical.   -     Mirtazapine; Take 1 tablet (15 mg total) by mouth at bedtime. May start wit 1/2 tablet at bedtime, then may increase to 1 tablet at bedtime if needed  Dispense: 90 tablet; Refill: 1     Return in about 6 months (around 09/17/2023) for annual physical exam.    Karie Georges, MD

## 2023-05-25 ENCOUNTER — Other Ambulatory Visit: Payer: Self-pay | Admitting: Family Medicine

## 2023-05-26 ENCOUNTER — Other Ambulatory Visit: Payer: Self-pay | Admitting: Family Medicine

## 2023-05-26 DIAGNOSIS — N521 Erectile dysfunction due to diseases classified elsewhere: Secondary | ICD-10-CM

## 2023-05-26 NOTE — Telephone Encounter (Signed)
 Patient informed of the message below and stated he is only taking Mirtazapine, not Trazodone.  Message sent to PCP for denial.

## 2023-05-26 NOTE — Telephone Encounter (Signed)
 I had switched him to mirtazepine since he reported that the trazodone was no longer working-- can you clarify which one he is currently taking?

## 2023-05-30 ENCOUNTER — Other Ambulatory Visit: Payer: Self-pay | Admitting: Family Medicine

## 2023-05-30 DIAGNOSIS — G4709 Other insomnia: Secondary | ICD-10-CM

## 2023-06-12 ENCOUNTER — Other Ambulatory Visit: Payer: Self-pay | Admitting: Family Medicine

## 2023-07-23 ENCOUNTER — Other Ambulatory Visit: Payer: Self-pay | Admitting: Family Medicine

## 2023-07-23 DIAGNOSIS — I10 Essential (primary) hypertension: Secondary | ICD-10-CM

## 2023-07-31 ENCOUNTER — Ambulatory Visit: Admitting: Family Medicine

## 2023-07-31 ENCOUNTER — Encounter: Payer: Self-pay | Admitting: Family Medicine

## 2023-07-31 VITALS — BP 128/80 | HR 64 | Temp 97.8°F | Ht 68.5 in | Wt 187.0 lb

## 2023-07-31 DIAGNOSIS — K219 Gastro-esophageal reflux disease without esophagitis: Secondary | ICD-10-CM | POA: Diagnosis not present

## 2023-07-31 DIAGNOSIS — S29011A Strain of muscle and tendon of front wall of thorax, initial encounter: Secondary | ICD-10-CM | POA: Diagnosis not present

## 2023-07-31 MED ORDER — OMEPRAZOLE 20 MG PO CPDR: 20.0000 mg | DELAYED_RELEASE_CAPSULE | Freq: Every day | ORAL | 0 refills | Status: DC

## 2023-07-31 NOTE — Progress Notes (Signed)
 Established Patient Office Visit   Subjective  Patient ID: Max [name], male    DOB: 02-06-1961  Age: 63 y.o. MRN: [id number]  Chief Complaint  Patient presents with   GI Problem    GERD started 4 weeks ago, Burning cough and stomach pain after eating acid foods, using- pepto-bismol    Flank Pain    Right side pain     Patient is a 63 year old male seen for ongoing concern.  Patient endorses acid reflux symptoms including burning sensation, pain in stomach, intermittent cough after eating tomato-based sauce foods 4 weeks ago.  Patient tried OTC Pepto-Bismol and Tums with some relief.  Patient typically works 12-hour shifts and may eat dinner around 8-ish then go to bed around 10.  Patient endorses eating spicy foods, tomato based products.  Patient also mentions right sided lateral chest pain with sneezing.  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  Feels like he strained his muscles.  Symptoms improving.  Unable to take ibuprofen as took "too much in the past" after dental procedure which affected his kidneys.    Patient Active Problem List   Diagnosis Date Noted   HLD (hyperlipidemia) 02/04/2022   Behavior-irritability 02/04/2022   Acute upper respiratory infection 01/18/2022   Hip impingement syndrome, left    Left hip pain 12/28/2020   Allergy-induced asthma 09/10/2019   Hypertension 09/03/2019   Low testosterone 09/03/2019   Past Medical History:  Diagnosis Date   Abnormal renal function    states labs were elevated due to taking ANSAIDS-he is treated by a urologist in [place]   Arthritis    Chronic kidney disease    stage 2   Hamstring tear    left   Hypertension    Pre-diabetes    Testosterone deficiency    treated by Delrae Rend MD[facility] [facility]   Past Surgical History:  Procedure Laterality Date   HIP ARTHROSCOPY Left 06/19/2021   Procedure: ARTHROSCOPY LEFT HIP WITH LABRAL REPAIR, CARM AND PINOR  DEBRIDEMENT;  Surgeon: Huel Cote, MD;  Location: [facility] OR;  Service: Orthopedics;  Laterality: Left;   NO PAST SURGERIES     Social History   Tobacco Use   Smoking status: Never   Smokeless tobacco: Never  Vaping Use   Vaping status: Never Used  Substance Use Topics   Alcohol use: Not Currently    Comment: quit at age 52   Drug use: Never   Family History  Problem Relation Age of Onset   Hypertension Mother    Hypertension Father    Bowel Disease Father        obstruction   Diabetes Sister    Hypertension Sister    Hypertension Sister    Allergies  Allergen Reactions   Ibuprofen     Affects kidney function significantly      ROS Negative unless stated above    Objective:     BP 128/80 (BP Location: Left Arm, Patient Position: Sitting, Cuff Size: Normal)   Pulse 64   Temp 97.8 F (36.6 C) (Oral)   Ht 5' 8.5" (1.74 m)   Wt 187 lb (84.8 kg)   SpO2 97%   BMI 28.02 kg/m  BP Readings from Last 3 Encounters:  07/31/23 128/80  03/20/23 108/68  09/12/22 102/80   Wt Readings from Last 3 Encounters:  07/31/23 187 lb (84.8 kg)  03/20/23 181 lb (82.1 kg)  09/12/22 187 lb 1.6 oz (84.9 kg)  Physical Exam Constitutional:      Appearance: Normal appearance.  HENT:     Head: Normocephalic and atraumatic.     Nose: Nose normal.     Mouth/Throat:     Mouth: Mucous membranes are moist.  Eyes:     Conjunctiva/sclera: Conjunctivae normal.  Cardiovascular:     Rate and Rhythm: Normal rate.  Pulmonary:     Effort: Pulmonary effort is normal.  Skin:    General: Skin is warm and dry.  Neurological:     Mental Status: He is alert and oriented to person, place, and time.     No results found for any visits on 07/31/23.    Assessment & Plan:  Gastroesophageal reflux disease, unspecified whether esophagitis present -     Omeprazole; Take 1 capsule (20 mg total) by mouth daily.  Dispense: 30 capsule; Refill: 0  Muscle strain of chest wall, initial  encounter  Patient with acid reflux symptoms. Start omeprazole 20 mg daily.  Avoid foods known to cause problems.  F/u with pcp.  Consider GI referral.  Tylenol prn and topical analgesics for msk pain.  Return if symptoms worsen or fail to improve.   Deeann Saint, MD

## 2023-08-17 ENCOUNTER — Other Ambulatory Visit: Payer: Self-pay | Admitting: Family Medicine

## 2023-08-17 DIAGNOSIS — R454 Irritability and anger: Secondary | ICD-10-CM

## 2023-08-22 ENCOUNTER — Other Ambulatory Visit: Payer: Self-pay | Admitting: Family Medicine

## 2023-08-22 DIAGNOSIS — K219 Gastro-esophageal reflux disease without esophagitis: Secondary | ICD-10-CM

## 2023-09-05 ENCOUNTER — Encounter: Payer: Self-pay | Admitting: Family Medicine

## 2023-09-05 ENCOUNTER — Ambulatory Visit: Admitting: Family Medicine

## 2023-09-05 VITALS — BP 126/84 | HR 66 | Temp 98.4°F | Ht 68.5 in | Wt 187.4 lb

## 2023-09-05 DIAGNOSIS — M79651 Pain in right thigh: Secondary | ICD-10-CM | POA: Diagnosis not present

## 2023-09-05 MED ORDER — CYCLOBENZAPRINE HCL 5 MG PO TABS: 5.0000 mg | ORAL_TABLET | Freq: Every evening | ORAL | 0 refills | Status: DC | PRN

## 2023-09-05 MED ORDER — CYCLOBENZAPRINE HCL 5 MG PO TABS: 5.0000 mg | ORAL_TABLET | Freq: Three times a day (TID) | ORAL | 0 refills | Status: DC | PRN

## 2023-09-05 NOTE — Progress Notes (Signed)
 Established Patient Office Visit   Subjective  Patient ID: Max [name], male    DOB: 25-Mar-1961  Age: 63 y.o. MRN: [id number]  Chief Complaint  Patient presents with   Leg Pain    Patient came in today for right leg pain, started 2 weeks ago, throbbing     Patient is a 63 year old male followed by Dr. Bambi Lever and seen for ongoing concern.  Patient endorses a throbbing in right thigh x 2 weeks or more.  Noticed when laying down at night pain is a 9/10 discomfort.  Patient states that is like his leg feels restless.  Exercising regularly.  Has gone to various stretch labs and tried Tylenol  and Tylenol  arthritis strength without relief.  Patient denies edema, erythema, increased warmth, hearing any popping or clicking, or injury.    Patient Active Problem List   Diagnosis Date Noted   HLD (hyperlipidemia) 02/04/2022   Behavior-irritability 02/04/2022   Acute upper respiratory infection 01/18/2022   Hip impingement syndrome, left    Left hip pain 12/28/2020   Allergy-induced asthma 09/10/2019   Hypertension 09/03/2019   Low testosterone  09/03/2019   Past Medical History:  Diagnosis Date   Abnormal renal function    states labs were elevated due to taking ANSAIDS-he is treated by a urologist in [place]   Arthritis    Chronic kidney disease    stage 2   Hamstring tear    left   Hypertension    Pre-diabetes    Testosterone  deficiency    treated by Marykay Snipes MD-[place],   Past Surgical History:  Procedure Laterality Date   HIP ARTHROSCOPY Left 06/19/2021   Procedure: ARTHROSCOPY LEFT HIP WITH LABRAL REPAIR, CARM AND PINOR DEBRIDEMENT;  Surgeon: Wilhelmenia Harada, MD;  Location: [facility];  Service: Orthopedics;  Laterality: Left;   NO PAST SURGERIES     Social History   Tobacco Use   Smoking status: Never   Smokeless tobacco: Never  Vaping Use   Vaping status: Never Used  Substance Use Topics   Alcohol use: Not Currently    Comment: quit at age 77   Drug use:  Never   Family History  Problem Relation Age of Onset   Hypertension Mother    Hypertension Father    Bowel Disease Father        obstruction   Diabetes Sister    Hypertension Sister    Hypertension Sister    Allergies  Allergen Reactions   Ibuprofen     Affects kidney function significantly      ROS Negative unless stated above    Objective:     BP 126/84 (BP Location: Left Arm, Patient Position: Sitting, Cuff Size: Normal)   Pulse 66   Temp 98.4 F (36.9 C) (Oral)   Ht 5' 8.5" (1.74 m)   Wt 187 lb 6.4 oz (85 kg)   SpO2 98%   BMI 28.08 kg/m  BP Readings from Last 3 Encounters:  09/05/23 126/84  07/31/23 128/80  03/20/23 108/68   Wt Readings from Last 3 Encounters:  09/05/23 187 lb 6.4 oz (85 kg)  07/31/23 187 lb (84.8 kg)  03/20/23 181 lb (82.1 kg)    Physical Exam Constitutional:      General: He is not in acute distress.    Appearance: Normal appearance.  HENT:     Head: Normocephalic and atraumatic.     Nose: Nose normal.     Mouth/Throat:     Mouth: Mucous membranes are moist.  Cardiovascular:     Rate and Rhythm: Normal rate and regular rhythm.     Heart sounds: Normal heart sounds. No murmur heard.    No gallop.  Pulmonary:     Effort: Pulmonary effort is normal. No respiratory distress.     Breath sounds: Normal breath sounds. No wheezing, rhonchi or rales.  Musculoskeletal:        General: No swelling, tenderness or deformity.     Right lower leg: No edema.     Left lower leg: No edema.  Skin:    General: Skin is warm and dry.  Neurological:     Mental Status: He is alert and oriented to person, place, and time.      No results found for any visits on 09/05/23.    Assessment & Plan:  Pain of right thigh -     Cyclobenzaprine  HCl; Take 1 tablet (5 mg total) by mouth 3 (three) times daily as needed for muscle spasms.  Dispense: 30 tablet; Refill: 0  Nonspecific pain of right medial thigh.  Possibly 2/2 muscle spasms or strain.   Discussed supportive care including heat, ice, stretching, topical analgesics, massage, etc.  Given Rx for muscle relaxer nightly as needed.  Follow-up with PCP as needed.  Return if symptoms worsen or fail to improve.   Viola Greulich, MD

## 2023-09-17 ENCOUNTER — Encounter: Payer: 59 | Admitting: Family Medicine

## 2023-09-25 ENCOUNTER — Ambulatory Visit (INDEPENDENT_AMBULATORY_CARE_PROVIDER_SITE_OTHER): Admitting: Family Medicine

## 2023-09-25 ENCOUNTER — Encounter: Payer: Self-pay | Admitting: Family Medicine

## 2023-09-25 VITALS — BP 106/72 | HR 79 | Temp 97.8°F | Ht 68.5 in | Wt 190.7 lb

## 2023-09-25 DIAGNOSIS — I10 Essential (primary) hypertension: Secondary | ICD-10-CM

## 2023-09-25 DIAGNOSIS — R7989 Other specified abnormal findings of blood chemistry: Secondary | ICD-10-CM

## 2023-09-25 DIAGNOSIS — Z0000 Encounter for general adult medical examination without abnormal findings: Secondary | ICD-10-CM

## 2023-09-25 DIAGNOSIS — R5383 Other fatigue: Secondary | ICD-10-CM | POA: Diagnosis not present

## 2023-09-25 LAB — COMPREHENSIVE METABOLIC PANEL WITH GFR
ALT: 21 U/L (ref 0–53)
AST: 23 U/L (ref 0–37)
Albumin: 4.4 g/dL (ref 3.5–5.2)
Alkaline Phosphatase: 58 U/L (ref 39–117)
BUN: 12 mg/dL (ref 6–23)
CO2: 28 meq/L (ref 19–32)
Calcium: 9.3 mg/dL (ref 8.4–10.5)
Chloride: 103 meq/L (ref 96–112)
Creatinine, Ser: 1.33 mg/dL (ref 0.40–1.50)
GFR: 57.65 mL/min — ABNORMAL LOW (ref >60.00)
Glucose, Bld: 86 mg/dL (ref 70–99)
Potassium: 3.9 meq/L (ref 3.5–5.1)
Sodium: 139 meq/L (ref 135–145)
Total Bilirubin: 0.3 mg/dL (ref 0.2–1.2)
Total Protein: 7.1 g/dL (ref 6.0–8.3)

## 2023-09-25 LAB — CBC
HCT: 44.5 % (ref 39.0–52.0)
Hemoglobin: 14.5 g/dL (ref 13.0–17.0)
MCHC: 32.5 g/dL (ref 30.0–36.0)
MCV: 83.4 fl (ref 78.0–100.0)
Platelets: 324 10*3/uL (ref 150.0–400.0)
RBC: 5.34 Mil/uL (ref 4.22–5.81)
RDW: 17 % — ABNORMAL HIGH (ref 11.5–15.5)
WBC: 5.9 10*3/uL (ref 4.0–10.5)

## 2023-09-25 LAB — VITAMIN B12: Vitamin B-12: 282 pg/mL (ref 211–911)

## 2023-09-25 MED ORDER — AMLODIPINE BESYLATE 10 MG PO TABS: 10.0000 mg | ORAL_TABLET | Freq: Every day | ORAL | 1 refills | Status: DC

## 2023-09-25 NOTE — Patient Instructions (Signed)
 Health Maintenance, Male  Adopting a healthy lifestyle and getting preventive care are important in promoting health and wellness. Ask your health care provider about:  The right schedule for you to have regular tests and exams.  Things you can do on your own to prevent diseases and keep yourself healthy.  What should I know about diet, weight, and exercise?  Eat a healthy diet    Eat a diet that includes plenty of vegetables, fruits, low-fat dairy products, and lean protein.  Do not eat a lot of foods that are high in solid fats, added sugars, or sodium.  Maintain a healthy weight  Body mass index (BMI) is a measurement that can be used to identify possible weight problems. It estimates body fat based on height and weight. Your health care provider can help determine your BMI and help you achieve or maintain a healthy weight.  Get regular exercise  Get regular exercise. This is one of the most important things you can do for your health. Most adults should:  Exercise for at least 150 minutes each week. The exercise should increase your heart rate and make you sweat (moderate-intensity exercise).  Do strengthening exercises at least twice a week. This is in addition to the moderate-intensity exercise.  Spend less time sitting. Even light physical activity can be beneficial.  Watch cholesterol and blood lipids  Have your blood tested for lipids and cholesterol at 62 years of age, then have this test every 5 years.  You may need to have your cholesterol levels checked more often if:  Your lipid or cholesterol levels are high.  You are older than 61 years of age.  You are at high risk for heart disease.  What should I know about cancer screening?  Many types of cancers can be detected early and may often be prevented. Depending on your health history and family history, you may need to have cancer screening at various ages. This may include screening for:  Colorectal cancer.  Prostate cancer.  Skin cancer.  Lung  cancer.  What should I know about heart disease, diabetes, and high blood pressure?  Blood pressure and heart disease  High blood pressure causes heart disease and increases the risk of stroke. This is more likely to develop in people who have high blood pressure readings or are overweight.  Talk with your health care provider about your target blood pressure readings.  Have your blood pressure checked:  Every 3-5 years if you are 9-95 years of age.  Every year if you are 85 years old or older.  If you are between the ages of 29 and 29 and are a current or former smoker, ask your health care provider if you should have a one-time screening for abdominal aortic aneurysm (AAA).  Diabetes  Have regular diabetes screenings. This checks your fasting blood sugar level. Have the screening done:  Once every three years after age 23 if you are at a normal weight and have a low risk for diabetes.  More often and at a younger age if you are overweight or have a high risk for diabetes.  What should I know about preventing infection?  Hepatitis B  If you have a higher risk for hepatitis B, you should be screened for this virus. Talk with your health care provider to find out if you are at risk for hepatitis B infection.  Hepatitis C  Blood testing is recommended for:  Everyone born from [birth year] through 1964.  Anyone  with known risk factors for hepatitis C.  Sexually transmitted infections (STIs)  You should be screened each year for STIs, including gonorrhea and chlamydia, if:  You are sexually active and are younger than 62 years of age.  You are older than 62 years of age and your health care provider tells you that you are at risk for this type of infection.  Your sexual activity has changed since you were last screened, and you are at increased risk for chlamydia or gonorrhea. Ask your health care provider if you are at risk.  Ask your health care provider about whether you are at high risk for HIV. Your health care provider  may recommend a prescription medicine to help prevent HIV infection. If you choose to take medicine to prevent HIV, you should first get tested for HIV. You should then be tested every 3 months for as long as you are taking the medicine.  Follow these instructions at home:  Alcohol use  Do not drink alcohol if your health care provider tells you not to drink.  If you drink alcohol:  Limit how much you have to 0-2 drinks a day.  Know how much alcohol is in your drink. In the [place] one drink equals one 12 oz bottle of beer (355 mL), one 5 oz glass of wine (148 mL), or one 1 oz glass of hard liquor (44 mL).  Lifestyle  Do not use any products that contain nicotine or tobacco. These products include cigarettes, chewing tobacco, and vaping devices, such as e-cigarettes. If you need help quitting, ask your health care provider.  Do not use street drugs.  Do not share needles.  Ask your health care provider for help if you need support or information about quitting drugs.  General instructions  Schedule regular health, dental, and eye exams.  Stay current with your vaccines.  Tell your health care provider if:  You often feel depressed.  You have ever been abused or do not feel safe at home.  Summary  Adopting a healthy lifestyle and getting preventive care are important in promoting health and wellness.  Follow your health care provider's instructions about healthy diet, exercising, and getting tested or screened for diseases.  Follow your health care provider's instructions on monitoring your cholesterol and blood pressure.  This information is not intended to replace advice given to you by your health care provider. Make sure you discuss any questions you have with your health care provider.  Document Revised: 09/18/2020 Document Reviewed: 09/18/2020  Elsevier Patient Education  2024 [employer].

## 2023-09-25 NOTE — Progress Notes (Signed)
 Complete physical exam  Patient: Max [name]   DOB: 1960/10/13   63 y.o. Male  MRN: [id number]  Subjective:     Chief Complaint  Patient presents with   Annual Exam    Max [name] is a 63 y.o. male who presents today for a complete physical exam. He reports consuming a general diet. Home exercise routine includes walking 2 hrs per week. He generally feels well. He reports sleeping fairly well. He does not have additional problems to discuss today.   Pt states he stopped the mirtazapine  due to morning grogginess. States that the cyclobenzaprine  does alos make him sleepy at night/  Most recent fall risk assessment:    07/31/2023    9:59 AM  Fall Risk   Falls in the past year? 0  Number falls in past yr: 0  Injury with Fall? 0  Risk for fall due to : No Fall Risks  Follow up Falls evaluation completed     Most recent depression screenings:    09/25/2023    9:55 AM 03/20/2023    9:27 AM  PHQ 2/9 Scores  PHQ - 2 Score 0 2  PHQ- 9 Score 0 5    Vision:Within last year and Dental: No current dental problems and Receives regular dental care  Patient Active Problem List   Diagnosis Date Noted   HLD (hyperlipidemia) 02/04/2022   Behavior-irritability 02/04/2022   Acute upper respiratory infection 01/18/2022   Hip impingement syndrome, left    Left hip pain 12/28/2020   Allergy-induced asthma 09/10/2019   Hypertension 09/03/2019   Low testosterone  09/03/2019      Patient Care Team: Aida House, MD as PCP - General (Family Medicine)   Outpatient Medications Prior to Visit  Medication Sig   acetaminophen  (TYLENOL ) 500 MG tablet Take 500 mg by mouth every 8 (eight) hours as needed for moderate pain.   Blood Pressure Monitoring (ADULT BLOOD PRESSURE CUFF LG) KIT 1 Device by Does not apply route daily as needed.   carboxymethylcellulose (REFRESH PLUS) 0.5 % SOLN 1 drop 3 (three) times daily as needed (dry eyes).   cholecalciferol (VITAMIN D3) 25 MCG  (1000 UNIT) tablet Take 1,000 Units by mouth daily.   cyclobenzaprine  (FLEXERIL ) 5 MG tablet Take 1 tablet (5 mg total) by mouth at bedtime as needed for muscle spasms.   omeprazole  (PRILOSEC) 20 MG capsule TAKE 1 CAPSULE BY MOUTH EVERY DAY   rosuvastatin  (CRESTOR ) 5 MG tablet TAKE 1 TABLET (5 MG TOTAL) BY MOUTH DAILY.   sertraline  (ZOLOFT ) 50 MG tablet TAKE 1 TABLET BY MOUTH EVERY DAY   tadalafil  (CIALIS ) 5 MG tablet TAKE 1 TABLET (5 MG TOTAL) BY MOUTH DAILY.   testosterone  cypionate (DEPOTESTOSTERONE CYPIONATE) 200 MG/ML injection Inject 1 mL (200 mg total) into the muscle once a week.   valsartan  (DIOVAN ) 40 MG tablet TAKE 1 TABLET BY MOUTH EVERY DAY   [DISCONTINUED] amLODipine  (NORVASC ) 10 MG tablet Take 1 tablet (10 mg total) by mouth daily.   [DISCONTINUED] mirtazapine  (REMERON  SOL-TAB) 15 MG disintegrating tablet TAKE 1 TABLET (15 MG TOTAL) BY MOUTH AT BEDTIME. MAY START WIT 1/2 TABLET AT BEDTIME, THEN MAY INCREASE TO 1 TABLET AT BEDTIME IF NEEDED (STOP TRAZODONE )   [DISCONTINUED] albuterol  (VENTOLIN  HFA) 108 (90 Base) MCG/ACT inhaler Inhale 2 puffs into the lungs every 6 (six) hours as needed for wheezing or shortness of breath.   [DISCONTINUED] bismuth subsalicylate (PEPTO BISMOL) 262 MG chewable tablet Chew 524 mg by mouth as  needed.   No facility-administered medications prior to visit.    Review of Systems  HENT:  Negative for hearing loss.   Eyes:  Negative for blurred vision.  Respiratory:  Negative for shortness of breath.   Cardiovascular:  Negative for chest pain.  Gastrointestinal: Negative.   Genitourinary: Negative.   Musculoskeletal:  Negative for back pain.  Neurological:  Negative for headaches.  Psychiatric/Behavioral:  Negative for depression.        Objective:     BP 106/72   Pulse 79   Temp 97.8 F (36.6 C) (Oral)   Ht 5' 8.5" (1.74 m)   Wt 190 lb 11.2 oz (86.5 kg)   SpO2 96%   BMI 28.57 kg/m    Physical Exam Vitals reviewed.  Constitutional:       Appearance: Normal appearance. He is well-groomed and normal weight.  HENT:     Right Ear: Tympanic membrane and ear canal normal.     Left Ear: Tympanic membrane and ear canal normal.     Mouth/Throat:     Mouth: Mucous membranes are moist.     Pharynx: No posterior oropharyngeal erythema.  Eyes:     Extraocular Movements: Extraocular movements intact.     Conjunctiva/sclera: Conjunctivae normal.  Neck:     Thyroid: No thyromegaly.  Cardiovascular:     Rate and Rhythm: Normal rate and regular rhythm.     Heart sounds: S1 normal and S2 normal. No murmur heard. Pulmonary:     Effort: Pulmonary effort is normal.     Breath sounds: Normal breath sounds and air entry. No rales.  Abdominal:     General: Abdomen is flat. Bowel sounds are normal.  Musculoskeletal:     Right lower leg: No edema.     Left lower leg: No edema.  Lymphadenopathy:     Cervical: No cervical adenopathy.  Neurological:     General: No focal deficit present.     Mental Status: He is alert and oriented to person, place, and time.     Gait: Gait is intact.  Psychiatric:        Mood and Affect: Mood and affect normal.      No results found for any visits on 09/25/23.     Assessment & Plan:    Routine Health Maintenance and Physical Exam  Immunization History  Administered Date(s) Administered   Influenza, High Dose Seasonal PF 02/19/2019   Influenza, Seasonal, Injecte, Preservative Fre 03/20/2023   Influenza,inj,Quad PF,6+ Mos 04/12/2020, 02/04/2022   Influenza-Unspecified 02/19/2019, 02/24/2021, 03/26/2021   PFIZER(Purple Top)SARS-COV-2 Vaccination 10/01/2019, 10/22/2019, 07/14/2020   Pfizer(Comirnaty)Fall Seasonal Vaccine 12 years and older 07/14/2020   Tdap 09/03/2019   Zoster Recombinant(Shingrix ) 10/15/2020, 10/25/2020, 09/05/2021    Health Maintenance  Topic Date Due   COVID-19 Vaccine (5 - 2024-25 season) 01/12/2023   Hepatitis C Screening  03/19/2024 (Originally 02/13/1980)   HIV  Screening  03/19/2024 (Originally 02/12/1977)   Pneumococcal Vaccine 48-109 Years old (1 of 2 - PCV) 07/30/2024 (Originally 02/12/1981)   INFLUENZA VACCINE  12/12/2023   Fecal DNA (Cologuard)  11/05/2025   DTaP/Tdap/Td (2 - Td or Tdap) 09/02/2029   Zoster Vaccines- Shingrix   Completed   HPV VACCINES  Aged Out   Meningococcal B Vaccine  Aged Out    Discussed health benefits of physical activity, and encouraged him to engage in regular exercise appropriate for his age and condition.  Routine general medical examination at a health care facility Normal physical exam findings today. I counseled  the patient on increasing exercise to 150 minutes per week as per the CDC recommendations. Handouts given on healthy eating and exercise. Labs have been ordered for annual surveillance today -- pt is on testosterone  and also taking PPI, needs CBC and B12 to monitor while on these medications. Health maintenance reviewed and he is UTD on this. RTC in 6 months for follow up on chronic health conditions.   Low testosterone  -     CBC; Future  Primary hypertension -     amLODIPine  Besylate; Take 1 tablet (10 mg total) by mouth daily.  Dispense: 90 tablet; Refill: 1 -     Comprehensive metabolic panel with GFR; Future  Other fatigue -     Vitamin B12; Future    Return in about 6 months (around 03/27/2024) for HTN.     Aida House, MD

## 2023-10-01 ENCOUNTER — Ambulatory Visit: Payer: Self-pay | Admitting: Family Medicine

## 2023-10-02 ENCOUNTER — Other Ambulatory Visit: Payer: Self-pay | Admitting: Family Medicine

## 2023-10-02 DIAGNOSIS — I10 Essential (primary) hypertension: Secondary | ICD-10-CM

## 2023-10-02 MED ORDER — VALSARTAN 40 MG PO TABS: 40.0000 mg | ORAL_TABLET | Freq: Every day | ORAL | 1 refills | Status: DC

## 2023-10-02 NOTE — Telephone Encounter (Signed)
 Copied from CRM [id number]. Topic: Clinical - Medication Refill >> Oct 02, 2023  9:04 AM Howard Macho wrote: Medication: valsartan  (DIOVAN ) 40 MG tablet  Has the patient contacted their pharmacy? No (Agent: If no, request that the patient contact the pharmacy for the refill. If patient does not wish to contact the pharmacy document the reason why and proceed with request.) (Agent: If yes, when and what did the pharmacy advise?)  This is the patient's preferred pharmacy:  [id number] [place], [place] - [address] [place] [address] Phone: [phone] Fax: [phone]  Is this the correct pharmacy for this prescription? Yes If no, delete pharmacy and type the correct one.   Has the prescription been filled recently? No  Is the patient out of the medication? Yes  Has the patient been seen for an appointment in the last year OR does the patient have an upcoming appointment? Yes  Can we respond through MyChart? Yes  Agent: Please be advised that Rx refills may take up to 3 business days. We ask that you follow-up with your pharmacy.

## 2023-10-15 ENCOUNTER — Other Ambulatory Visit: Payer: Self-pay | Admitting: Family Medicine

## 2023-10-15 DIAGNOSIS — N521 Erectile dysfunction due to diseases classified elsewhere: Secondary | ICD-10-CM

## 2023-11-25 ENCOUNTER — Ambulatory Visit: Payer: Self-pay | Admitting: *Deleted

## 2023-11-25 NOTE — Telephone Encounter (Signed)
    FYI Only or Action Required?: Action required by provider: request for appointment.  Patient was last seen in primary care on 09/25/2023 by Ozell Heron HERO, MD.  Called Nurse Triage reporting Pain.  Symptoms began several weeks ago.  Interventions attempted: OTC medications: tylenol .  Symptoms are: unchanged.  Triage Disposition: See PCP When Office is Open (Within 3 Days)  Patient/caregiver understands and will follow disposition?: Yes                Copied from CRM [id number]. Topic: Clinical - Red Word Triage >> Nov 25, 2023 11:51 AM Suzen RAMAN wrote: Red Word that prompted transfer to Nurse Triage: Right Finger pain w/burning Reason for Disposition  [1] MODERATE pain (e.g., interferes with normal activities) AND [2] present > 3 days  Answer Assessment - Initial Assessment Questions Appt scheduled for 11/27/23. Patient denies swelling no fever no pain in right groin or calf area , no swelling no redness no fever right thumb     1. ONSET: When did the pain start?      Couple of weeks  2. LOCATION and RADIATION: Where is the pain located?  (e.g., fingertip, around nail, joint, entire      Right finger , thumb area and reports throbbing pain in right inner thigh when laying down, no pain when standing 3. SEVERITY: How bad is the pain? What does it keep you from doing?   (Scale 1-10; or mild, moderate, severe)     7/10 4. APPEARANCE: What does the finger look like? (e.g., redness, swelling, bruising, pallor)     Normal  5. WORK OR EXERCISE: Has there been any recent work or exercise that involved this part (i.e., fingers or hand) of the body?     Drives fork lift  6. CAUSE: What do you think is causing the pain?     Not sure  7. AGGRAVATING FACTORS: What makes the pain worse? (e.g., using computer)     Driving fork lift 8. OTHER SYMPTOMS: Do you have any other symptoms? (e.g., fever, neck pain, numbness)     Burning pain in thumb tylenol   not effective hx kidney issues 9. PREGNANCY: Is there any chance you are pregnant? When was your last menstrual period?     na  Protocols used: Finger Pain-A-AH

## 2023-11-25 NOTE — Telephone Encounter (Signed)
 Noted- ok to close.

## 2023-11-27 ENCOUNTER — Ambulatory Visit (INDEPENDENT_AMBULATORY_CARE_PROVIDER_SITE_OTHER): Admitting: Family Medicine

## 2023-11-27 ENCOUNTER — Encounter: Payer: Self-pay | Admitting: Family Medicine

## 2023-11-27 ENCOUNTER — Encounter: Payer: Self-pay | Admitting: Neurology

## 2023-11-27 VITALS — BP 120/64 | HR 78 | Temp 98.3°F | Ht 68.5 in | Wt 187.6 lb

## 2023-11-27 DIAGNOSIS — R202 Paresthesia of skin: Secondary | ICD-10-CM | POA: Diagnosis not present

## 2023-11-27 DIAGNOSIS — M79651 Pain in right thigh: Secondary | ICD-10-CM

## 2023-11-27 DIAGNOSIS — M76891 Other specified enthesopathies of right lower limb, excluding foot: Secondary | ICD-10-CM | POA: Diagnosis not present

## 2023-11-27 MED ORDER — DICLOFENAC SODIUM 1 % EX GEL: 4.0000 g | Freq: Four times a day (QID) | CUTANEOUS | 2 refills | Status: AC | PRN

## 2023-11-27 MED ORDER — CYCLOBENZAPRINE HCL 5 MG PO TABS: 5.0000 mg | ORAL_TABLET | Freq: Every evening | ORAL | 0 refills | Status: AC | PRN

## 2023-11-27 NOTE — Progress Notes (Signed)
 Established Patient Office Visit  Subjective   Patient ID: Max [name], male    DOB: [birth year]  Age: 63 y.o. 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 Patient states that the thumb pain is intermittent, it is in between the thumb and irst finger, states that it gets worse when he touches the area. No other numbness or tingling, states he does feel a loss grip strength.   Right thigh pain-- has recurred, was seen before for this. States it hurts worse when he lays flat, stretching the back of the leg.     Current Outpatient Medications  Medication Instructions   acetaminophen  (TYLENOL ) 500 mg, Every 8 hours PRN   amLODipine  (NORVASC ) 10 mg, Oral, Daily   Blood Pressure Monitoring (ADULT BLOOD PRESSURE CUFF LG) KIT 1 Device, Does not apply, Daily PRN   carboxymethylcellulose (REFRESH PLUS) 0.5 % SOLN 1 drop, 3 times daily PRN   cholecalciferol (VITAMIN D3) 1,000 Units, Daily   cyclobenzaprine  (FLEXERIL ) 5 mg, Oral, At bedtime PRN   diclofenac  Sodium (VOLTAREN ) 4 g, Topical, 4 times daily PRN   omeprazole  (PRILOSEC) 20 MG capsule Oral, Daily   rosuvastatin  (CRESTOR ) 5 mg, Oral, Daily   sertraline  (ZOLOFT ) 50 mg, Oral, Daily   tadalafil  (CIALIS ) 5 mg, Oral, Daily   testosterone  cypionate (DEPOTESTOSTERONE CYPIONATE) 200 mg, Intramuscular, Weekly   valsartan  (DIOVAN ) 40 mg, Oral, Daily    Patient Active Problem List   Diagnosis Date Noted   HLD (hyperlipidemia) 02/04/2022   Behavior-irritability 02/04/2022   Acute upper respiratory infection 01/18/2022   Hip impingement syndrome, left     Left hip pain 12/28/2020   Allergy-induced asthma 09/10/2019   Hypertension 09/03/2019   Low testosterone  09/03/2019      Review of Systems  All other systems reviewed and are negative.     Objective:     BP 120/64   Pulse 78   Temp 98.3 F (36.8 C) (Oral)   Ht 5' 8.5 (1.74 m)   Wt 187 lb 9.6 oz (85.1 kg)   SpO2 97%   BMI 28.11 kg/m    Physical Exam Vitals reviewed.  Constitutional:      Appearance: Normal appearance. He is normal weight.  Pulmonary:     Effort: Pulmonary effort is normal.  Musculoskeletal:     Right hand: Tenderness (in the space between the thumb and first digit,) present. No swelling or deformity. Normal range of motion.     Right upper leg: Normal. No swelling, edema, deformity or tenderness.  Neurological:     Mental Status: He is alert.      No results found for any visits on 11/27/23.    The 10-year ASCVD risk score (Arnett DK, et al., 2019) is: 11.3%    Assessment & Plan:  Right hand paresthesia -     Ambulatory referral to Neurology  Pain of right thigh -     Diclofenac  Sodium; Apply 4 g topically 4 (four) times daily as needed.  Dispense: 100 g; Refill: 2 -  Cyclobenzaprine  HCl; Take 1 tablet (5 mg total) by mouth at bedtime as needed for muscle spasms.  Dispense: 30 tablet; Refill: 0  Hamstring tendonitis of right thigh -     Ambulatory referral to Orthopedics   Thumb issue could represent carpal tinnel or nerve issue since he is describing burning. I recommended EMG, will place referral to neuro to have this done, also pt reports hamstring pain, this is the second visit for this (was seen on 4/25 by Dr. Mercer) will refill the cyclobenzaprine  PRN and add diclofenac  gel QID. Sent new referral to orthopedics for evaluation since he reports he tore his hamstring in the past.   No follow-ups on file.    Heron CHRISTELLA Sharper, MD

## 2023-11-28 ENCOUNTER — Other Ambulatory Visit: Payer: Self-pay

## 2023-11-28 DIAGNOSIS — R202 Paresthesia of skin: Secondary | ICD-10-CM

## 2023-12-01 ENCOUNTER — Other Ambulatory Visit: Payer: Self-pay | Admitting: Family Medicine

## 2023-12-01 DIAGNOSIS — K219 Gastro-esophageal reflux disease without esophagitis: Secondary | ICD-10-CM

## 2023-12-02 ENCOUNTER — Ambulatory Visit (INDEPENDENT_AMBULATORY_CARE_PROVIDER_SITE_OTHER): Admitting: Physician Assistant

## 2023-12-02 DIAGNOSIS — S76319A Strain of muscle, fascia and tendon of the posterior muscle group at thigh level, unspecified thigh, initial encounter: Secondary | ICD-10-CM | POA: Insufficient documentation

## 2023-12-02 NOTE — Progress Notes (Signed)
 Office Visit Note   Patient: Max [name]           Date of Birth: 02/11/1961           MRN: [id number] Visit Date: 12/02/2023              Requested by: Ozell Heron HERO, MD [address] [place],  [place] [address] PCP: Ozell Heron HERO, MD  No chief complaint on file.     HPI: Patient is a pleasant 63 year old gentleman who comes in with chief complaint of pain in the posterior medial aspect of his thigh.  Distally.  Been going on for about 2 weeks.  He does workout at the gym and finds it is most painful when he tries to do curls.   Assessment & Plan: Visit Diagnoses:  1. Hamstring strain, unspecified laterality, initial encounter     Plan: No bony pain pain over the medial hamstrings.  Most likely hamstring strain.  Given given his activity I have asked him not to do the hamstring curls for now.  Will have him engage with physical therapy should follow-up up if he does not improve  Follow-Up Instructions: No follow-ups on file.   Ortho Exam  Patient is alert, oriented, no adenopathy, well-dressed, normal affect, normal respiratory effort. Examination of his right leg he has no tenderness about the knee no instability.  He is tender over the distal medial hamstring.  Hurts with straight leg raising.  No tenderness over the femur.  No swelling no erythema no ecchymosis.  He has good strength.    Imaging: No results found. No images are attached to the encounter.  Labs: Lab Results  Component Value Date   ESRSEDRATE 7 12/15/2020     Lab Results  Component Value Date   ALBUMIN 4.4 09/25/2023   ALBUMIN 4.3 09/12/2022   ALBUMIN 4.3 02/14/2022    No results found for: MG No results found for: VD25OH  No results found for: PREALBUMIN    Latest Ref Rng & Units 09/25/2023   10:37 AM 09/12/2022   10:13 AM 09/05/2021   10:38 AM  CBC EXTENDED  WBC 4.0 - 10.5 K/uL 5.9  7.1  6.5   RBC 4.22 - 5.81 Mil/uL 5.34  5.87  5.26   Hemoglobin 13.0 -  17.0 g/dL 85.4  83.0  84.0   HCT 39.0 - 52.0 % 44.5  51.0  47.6   Platelets 150.0 - 400.0 K/uL 324.0  286.0  278.0   NEUT# 1.4 - 7.7 K/uL   3.6   Lymph# 0.7 - 4.0 K/uL   2.1      There is no height or weight on file to calculate BMI.  Orders:  No orders of the defined types were placed in this encounter.  No orders of the defined types were placed in this encounter.    Procedures: No procedures performed  Clinical Data: No additional findings.  ROS:  All other systems negative, except as noted in the HPI. Review of Systems  Objective: Vital Signs: There were no vitals taken for this visit.  Specialty Comments:  No specialty comments available.  PMFS History: Patient Active Problem List   Diagnosis Date Noted   Hamstring strain 12/02/2023   HLD (hyperlipidemia) 02/04/2022   Behavior-irritability 02/04/2022   Acute upper respiratory infection 01/18/2022   Hip impingement syndrome, left    Left hip pain 12/28/2020   Allergy-induced asthma 09/10/2019   Hypertension 09/03/2019   Low testosterone  09/03/2019  Past Medical History:  Diagnosis Date   Abnormal renal function    states labs were elevated due to taking ANSAIDS-he is treated by a urologist in [place]   Arthritis    Chronic kidney disease    stage 2   Hamstring tear    left   Hypertension    Pre-diabetes    Testosterone  deficiency    treated by [facility]    Family History  Problem Relation Age of Onset   Hypertension Mother    Hypertension Father    Bowel Disease Father        obstruction   Diabetes Sister    Hypertension Sister    Hypertension Sister     Past Surgical History:  Procedure Laterality Date   HIP ARTHROSCOPY Left 06/19/2021   Procedure: ARTHROSCOPY LEFT HIP WITH LABRAL REPAIR, CARM AND PINOR DEBRIDEMENT;  Surgeon: Genelle Standing, MD;  Location: [facility] OR;  Service: Orthopedics;  Laterality: Left;   NO PAST SURGERIES     Social History   Occupational History    Not on file  Tobacco Use   Smoking status: Never   Smokeless tobacco: Never  Vaping Use   Vaping status: Never Used  Substance and Sexual Activity   Alcohol use: Not Currently    Comment: quit at age 86   Drug use: Never   Sexual activity: Yes

## 2023-12-02 NOTE — Addendum Note (Signed)
 Addended by: RODGERS LACY on: 12/02/2023 11:53 AM   Modules accepted: Orders

## 2023-12-11 ENCOUNTER — Other Ambulatory Visit: Payer: Self-pay | Admitting: Family Medicine

## 2023-12-15 ENCOUNTER — Encounter: Payer: Self-pay | Admitting: Family Medicine

## 2023-12-15 ENCOUNTER — Ambulatory Visit: Admitting: Family Medicine

## 2023-12-15 ENCOUNTER — Ambulatory Visit: Payer: Self-pay

## 2023-12-15 VITALS — BP 138/80 | HR 82 | Temp 98.1°F | Resp 16 | Ht 68.5 in | Wt 191.0 lb

## 2023-12-15 DIAGNOSIS — M7541 Impingement syndrome of right shoulder: Secondary | ICD-10-CM

## 2023-12-15 DIAGNOSIS — M25511 Pain in right shoulder: Secondary | ICD-10-CM

## 2023-12-15 MED ORDER — TRAMADOL HCL 50 MG PO TABS: 50.0000 mg | ORAL_TABLET | Freq: Two times a day (BID) | ORAL | 0 refills | Status: AC | PRN

## 2023-12-15 MED ORDER — TRIAMCINOLONE ACETONIDE 40 MG/ML IJ SUSP
40.0000 mg | Freq: Once | INTRAMUSCULAR | Status: AC
  Administered 2023-12-15: 40 mg via INTRA_ARTICULAR

## 2023-12-15 NOTE — Telephone Encounter (Signed)
 FYI Only or Action Required?: FYI only for provider.  Patient was last seen in primary care on 11/27/2023 by Ozell Heron HERO, MD.  Called Nurse Triage reporting Shoulder Pain.  Symptoms began several days ago.  Interventions attempted: Nothing.  Symptoms are: unchanged. Shoulder pain - hx of torn rotator cuff.  Triage Disposition: See PCP When Office is Open (Within 3 Days)  Patient/caregiver understands and will follow disposition?: Yes                Copied from CRM #[id number]. Topic: Clinical - Red Word Triage >> Dec 15, 2023  7:47 AM Robinson H wrote: [facility] that prompted transfer to Nurse Triage: Pain in right shoulder Reason for Disposition  [1] MODERATE pain (e.g., interferes with normal activities) AND [2] present > 3 days  Answer Assessment - Initial Assessment Questions 1. ONSET: When did the pain start?     5 days 2. LOCATION: Where is the pain located?     Right shoulder 3. PAIN: How bad is the pain? (Scale 1-10; or mild, moderate, severe)     Fair amount when lifting his arm. 4. WORK OR EXERCISE: Has there been any recent work or exercise that involved this part of the body?     Yes - working out 5. CAUSE: What do you think is causing the shoulder pain?     Hx - Rotator  cuff injury 6. OTHER SYMPTOMS: Do you have any other symptoms? (e.g., neck pain, swelling, rash, fever, numbness, weakness)     no  Protocols used: Shoulder Pain-A-AH

## 2023-12-15 NOTE — Progress Notes (Signed)
 ACUTE VISIT Chief Complaint  Patient presents with   Shoulder Pain    Right shoulder x 5 days, hx of rotator cuff injury    HPI: [name] is a 63 y.o. male with PMHx significant for HTN, asthma,and HLD here today complaining of right shoulder pain, ongoing 5 days.   He believes he may have injured himself while doing arm exercises, although he does not recall a specific onset of pain while exercising.  Throbbing, bad pain, exacerbated by lying on his left side. He has not noted edema,erythema, or limitation of ROM.  Rates pain a 5/10; worsening with rotation/movement of the arm.   Shoulder Pain  This is a new problem. The current episode started in the past 7 days. The problem occurs intermittently. The problem has been gradually worsening. Pertinent negatives include no fever, inability to bear weight, itching, joint locking, joint swelling, numbness, stiffness or tingling. The symptoms are aggravated by activity. He has tried acetaminophen  for the symptoms. The treatment provided no relief. Family history does not include gout or rheumatoid arthritis. His past medical history is significant for osteoarthritis.   Has been taking Tylenol  for pain relief.  Avoids taking any NSAIDs due to hx of kidney issues.  Voltaren  gel has not help.  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  Denies any fever, cough, dyspnea, or wheezing.  He also reports a hx of carpal tunnel with associated pain and burning, for which he has already been referred by neurologist..   Review of Systems  Constitutional:  Positive for activity change. Negative for appetite change, chills and fever.  HENT:  Negative for mouth sores and sore throat.   Cardiovascular:  Negative for chest pain and palpitations.  Gastrointestinal:  Negative for abdominal pain and nausea.  Musculoskeletal:  Negative for stiffness.  Skin:  Negative for itching  and rash.  Neurological:  Negative for tingling, syncope, weakness and numbness.  Psychiatric/Behavioral:  Positive for sleep disturbance. The patient is nervous/anxious.   See other pertinent positives and negatives in HPI.  Current Outpatient Medications on File Prior to Visit  Medication Sig Dispense Refill   acetaminophen  (TYLENOL ) 500 MG tablet Take 500 mg by mouth every 8 (eight) hours as needed for moderate pain.     amLODipine  (NORVASC ) 10 MG tablet Take 1 tablet (10 mg total) by mouth daily. 90 tablet 1   Blood Pressure Monitoring (ADULT BLOOD PRESSURE CUFF LG) KIT 1 Device by Does not apply route daily as needed. 1 kit 0   carboxymethylcellulose (REFRESH PLUS) 0.5 % SOLN 1 drop 3 (three) times daily as needed (dry eyes).     cholecalciferol (VITAMIN D3) 25 MCG (1000 UNIT) tablet Take 1,000 Units by mouth daily.     cyclobenzaprine  (FLEXERIL ) 5 MG tablet Take 1 tablet (5 mg total) by mouth at bedtime as needed for muscle spasms. 30 tablet 0   diclofenac  Sodium (VOLTAREN ) 1 % GEL Apply 4 g topically 4 (four) times daily as needed. 100 g 2   omeprazole  (PRILOSEC) 20 MG capsule TAKE 1 CAPSULE BY MOUTH EVERY DAY 90 capsule 1   rosuvastatin  (CRESTOR ) 5 MG tablet TAKE 1 TABLET (5 MG TOTAL) BY MOUTH DAILY. 90 tablet 1   sertraline  (ZOLOFT ) 50 MG tablet TAKE 1 TABLET BY MOUTH EVERY DAY 90 tablet 1   tadalafil  (CIALIS ) 5 MG tablet TAKE 1 TABLET (5 MG TOTAL) BY MOUTH DAILY. 90 tablet 0  testosterone  cypionate (DEPOTESTOSTERONE CYPIONATE) 200 MG/ML injection Inject 1 mL (200 mg total) into the muscle once a week. 12 mL 3   valsartan  (DIOVAN ) 40 MG tablet Take 1 tablet (40 mg total) by mouth daily. 90 tablet 1   No current facility-administered medications on file prior to visit.   Past Medical History:  Diagnosis Date   Abnormal renal function    states labs were elevated due to taking ANSAIDS-he is treated by a urologist in [place]   Arthritis    Chronic kidney disease    stage 2    Hamstring tear    left   Hypertension    Pre-diabetes    Testosterone  deficiency    treated by [facility],   No Active Allergies  Social History   Socioeconomic History   Marital status: Single    Spouse name: Not on file   Number of children: Not on file   Years of education: Not on file   Highest education level: Not on file  Occupational History   Not on file  Tobacco Use   Smoking status: Never   Smokeless tobacco: Never  Vaping Use   Vaping status: Never Used  Substance and Sexual Activity   Alcohol use: Not Currently    Comment: quit at age 39   Drug use: Never   Sexual activity: Yes  Other Topics Concern   Not on file  Social History Narrative   Not on file   Social Drivers of Health   Financial Resource Strain: Not on file  Food Insecurity: Not on file  Transportation Needs: Not on file  Physical Activity: Not on file  Stress: Not on file  Social Connections: Not on file   Vitals:   12/15/23 0817  BP: 138/80  Pulse: 82  Resp: 16  Temp: 98.1 F (36.7 C)  SpO2: 97%   Body mass index is 28.62 kg/m.  Physical Exam Vitals and nursing note reviewed.  Constitutional:      General: He is not in acute distress.    Appearance: He is well-developed.  HENT:     Head: Normocephalic and atraumatic.  Eyes:     Conjunctiva/sclera: Conjunctivae normal.  Cardiovascular:     Rate and Rhythm: Normal rate and regular rhythm.     Heart sounds: No murmur heard. Pulmonary:     Effort: Pulmonary effort is normal. No respiratory distress.     Breath sounds: Normal breath sounds.  Musculoskeletal:     Comments: Shoulder: No deformity, edema, or erythema appreciated.No muscle atrophy. Vonzell' test pos, drop arm rotator cuff test neg, empty can supraspinatus test pos, lift-Off Subscapularis test elicits mild pain. Normal active and passive abduction, it elicits pain.  Lymphadenopathy:     Cervical: No cervical adenopathy.  Skin:    Findings: No  erythema or rash.  Neurological:     General: No focal deficit present.     Mental Status: He is alert and oriented to person, place, and time.     Gait: Gait normal.  Psychiatric:        Mood and Affect: Mood and affect normal.    ASSESSMENT AND PLAN: [name] was seen today for right shoulder pain.  Acute pain of right shoulder We discussed possible etiologies. No hx of trauma, so I do not think imaging is needed. Hx of abnormal e GFR, so NSAID's are not indicated. He would like something for pain mainly at night to help him sleep. Tramadol  side  effects discussed. Note for work provided.  -     Triamcinolone  Acetonide -     traMADol  HCl; Take 1 tablet (50 mg total) by mouth every 12 (twelve) hours as needed for up to 5 days.  Dispense: 10 tablet; Refill: 0 -     Arthrocentesis  Impingement syndrome of right shoulder ?Rotator cuff tendinitis. We discussed Dx ,prognosis,and treatment options. No significant limitation of ROM, PT offered, he prefers to hold on it. Requesting steroid injection to help with inflammation. After verbal consent and discussion of possible complications, he received a subacromial injection, posterior approach, triamcinolone  40 mg + plain Lidocaine  2 cc.Area cleaned with betadine sl and alcohol. Tolerated procedure well, reports 50% pain improvement 10 min after injection. Post procedure instructions given.  -     Triamcinolone  Acetonide -     traMADol  HCl; Take 1 tablet (50 mg total) by mouth every 12 (twelve) hours as needed for up to 5 days.  Dispense: 10 tablet; Refill: 0 -     Arthrocentesis  Joint Injection/Arthrocentesis  Date/Time: 12/15/2023 9:29 AM  Performed by: [name], Moe Graca G, MD Authorized by: [name], Yvett Rossel G, MD  Indications: pain  Body area: shoulder Joint: right subacromial bursa Local anesthesia used: no  Anesthesia: Local anesthesia used: no  Sedation: Patient sedated: no  Preparation: Patient was prepped  and draped in the usual sterile fashion. Needle gauge: 25 G, 1 1/2. Ultrasound guidance: no Approach: posterior Triamcinolone  amount: 40 mg Lidocaine  1% amount: 2 mL Patient tolerance: patient tolerated the procedure well with no immediate complications   Return if symptoms worsen or fail to improve, for keep next appointment.  I, Vernell Forest, acting as a scribe for Koriana Stepien [place], MD., have documented all relevant documentation on the behalf of Max Bolds [place], MD, as directed by   while in the presence of Kindal Ponti [place], MD.  I, Servando Kyllonen [place], MD, have reviewed all documentation for this visit. The documentation on 12/15/23 for the exam, diagnosis, procedures, and orders are all accurate and complete.  Max Komatsu G. [place], MD  [facility]. [place] office.

## 2023-12-15 NOTE — Telephone Encounter (Signed)
 Noted- ok to close.

## 2023-12-15 NOTE — Patient Instructions (Addendum)
 A few things to remember from today's visit:  Impingement syndrome of right shoulder - Plan: triamcinolone  acetonide (KENALOG -40) injection 40 mg, traMADol  (ULTRAM ) 50 MG tablet  Acute pain of right shoulder - Plan: triamcinolone  acetonide (KENALOG -40) injection 40 mg, traMADol  (ULTRAM ) 50 MG tablet  Let me know if you are interested din PT. Tramadol  to take mainly at night. Continue range of motion exercises.  If you need refills for medications you take chronically, please call your pharmacy. Do not use My Chart to request refills or for acute issues that need immediate attention. If you send a my chart message, it may take a few days to be addressed, specially if I am not in the office.  Please be sure medication list is accurate. If a new problem present, please set up appointment sooner than planned today.

## 2023-12-15 NOTE — Telephone Encounter (Signed)
 Appt today with Dr [place] at 8:30am.

## 2023-12-19 ENCOUNTER — Other Ambulatory Visit: Payer: Self-pay | Admitting: Family Medicine

## 2023-12-19 ENCOUNTER — Telehealth: Payer: Self-pay

## 2023-12-19 DIAGNOSIS — M25511 Pain in right shoulder: Secondary | ICD-10-CM

## 2023-12-19 MED ORDER — CELECOXIB 100 MG PO CAPS: 100.0000 mg | ORAL_CAPSULE | Freq: Two times a day (BID) | ORAL | 0 refills | Status: AC

## 2023-12-19 NOTE — Telephone Encounter (Signed)
 Copied from CRM [id number]. Topic: General - Other >> Dec 19, 2023  8:05 AM Burnard DEL wrote: Reason for CRM: Patient was advised to call if his shoulder wasn't feeling better by today and celebrex  would be prescribed for him. Patient called in today wanting to know if he could be prescribed that medication due to the pain still being in his shoulder?  [facility] [address] [facility] [place], [place] - [address] [place]  Phone: [phone] Fax: [phone]

## 2023-12-19 NOTE — Telephone Encounter (Signed)
 I spoke with pt. He is aware of message below. He declined PT for now as he is already in PT for his hip. Pt is aware to f/u with PCP if Celebrex  doesn't help.

## 2023-12-19 NOTE — Telephone Encounter (Signed)
 I was hoping that the subacromial steroid injection was going to help, so we would not need NSAIDs. We already had a discussion about side effects during visit, he can try Celebrex  100 mg twice daily for 7 days. Rx sent. He declined PT, if he agrees, referral can be placed. Follow-up with PCP if pain is persistent. Thanks, BJ

## 2024-01-08 ENCOUNTER — Ambulatory Visit: Admitting: Neurology

## 2024-01-08 DIAGNOSIS — R202 Paresthesia of skin: Secondary | ICD-10-CM

## 2024-01-08 NOTE — Procedures (Signed)
  [facility] Neurology  [address] [place], [address]  [place], [place] [address] Tel: [phone] Fax: [phone] Test Date:  01/08/2024  Patient: Max [name] DOB: 01-25-1961 Physician: Tonita Blanch, DO  Sex: Male Height: 5' 8 Ref Phys: Heron Sharper, MD  ID#: [id number]   Technician:    History: This is a 63 year old man referred for evaluation of right hand pain.  NCV & EMG Findings: Extensive electrodiagnostic testing of the right upper extremity shows:  Right median, ulnar, and mixed palmar sensory responses are within normal limits. Right median and ulnar motor responses are within normal limits. There is no evidence of active or chronic motor axonal loss changes affecting any of the tested muscles.  Motor unit configuration and recruitment pattern is within normal limits.  Impression: This is a normal study of the right upper extremity.  In particular, there is no evidence of a cervical radiculopathy or carpal tunnel syndrome.   ___________________________ Tonita Blanch, DO    Nerve Conduction Studies   Stim Site NR Peak (ms) Norm Peak (ms) O-P Amp (V) Norm O-P Amp  Right Median Anti Sensory (2nd Digit)  32 C  Wrist    3.2 <3.8 30.4 >10  Right Ulnar Anti Sensory (5th Digit)  32 C  Wrist    3.1 <3.2 28.1 >5     Stim Site NR Onset (ms) Norm Onset (ms) O-P Amp (mV) Norm O-P Amp Site1 Site2 Delta-0 (ms) Dist (cm) Vel (m/s) Norm Vel (m/s)  Right Median Motor (Abd Poll Brev)  32 C  Wrist    3.0 <4.0 10.4 >5 Elbow Wrist 5.7 31.0 54 >50  Elbow    8.7  10.3         Right Ulnar Motor (Abd Dig Minimi)  32 C  Wrist    2.7 <3.1 9.0 >7 B Elbow Wrist 4.1 23.0 56 >50  B Elbow    6.8  8.3  A Elbow B Elbow 1.8 10.0 56 >50  A Elbow    8.6  8.1            Stim Site NR Peak (ms) Norm Peak (ms) P-T Amp (V) Site1 Site2 Delta-P (ms) Norm Delta (ms)  Right Median/Ulnar Palm Comparison (Wrist - 8cm)  32 C  Median Palm    1.7 <2.2 107.4 Median Palm Ulnar Palm 0.1   Ulnar  Palm    1.8 <2.2 10.8       Electromyography   Side Muscle Ins.Act Fibs Fasc Recrt Amp Dur Poly Activation Comment  Right 1stDorInt Nml Nml Nml Nml Nml Nml Nml Nml N/A  Right Abd Poll Brev Nml Nml Nml Nml Nml Nml Nml Nml N/A  Right PronatorTeres Nml Nml Nml Nml Nml Nml Nml Nml N/A  Right Biceps Nml Nml Nml Nml Nml Nml Nml Nml N/A  Right Triceps Nml Nml Nml Nml Nml Nml Nml Nml N/A  Right Deltoid Nml Nml Nml Nml Nml Nml Nml Nml N/A      Waveforms:

## 2024-01-09 ENCOUNTER — Ambulatory Visit: Payer: Self-pay | Admitting: Family Medicine

## 2024-01-14 ENCOUNTER — Ambulatory Visit (HOSPITAL_BASED_OUTPATIENT_CLINIC_OR_DEPARTMENT_OTHER): Admitting: Physical Therapy

## 2024-01-14 ENCOUNTER — Encounter (HOSPITAL_BASED_OUTPATIENT_CLINIC_OR_DEPARTMENT_OTHER): Payer: Self-pay

## 2024-02-13 ENCOUNTER — Other Ambulatory Visit: Payer: Self-pay | Admitting: Family Medicine

## 2024-02-13 DIAGNOSIS — R454 Irritability and anger: Secondary | ICD-10-CM

## 2024-03-15 ENCOUNTER — Encounter: Payer: Self-pay | Admitting: Radiology

## 2024-03-25 ENCOUNTER — Encounter: Payer: Self-pay | Admitting: Family Medicine

## 2024-03-25 ENCOUNTER — Ambulatory Visit: Admitting: Family Medicine

## 2024-03-25 VITALS — BP 118/60 | HR 73 | Temp 97.5°F | Ht 68.5 in | Wt 190.8 lb

## 2024-03-25 DIAGNOSIS — E782 Mixed hyperlipidemia: Secondary | ICD-10-CM | POA: Diagnosis not present

## 2024-03-25 DIAGNOSIS — R7989 Other specified abnormal findings of blood chemistry: Secondary | ICD-10-CM

## 2024-03-25 DIAGNOSIS — G4709 Other insomnia: Secondary | ICD-10-CM

## 2024-03-25 DIAGNOSIS — I10 Essential (primary) hypertension: Secondary | ICD-10-CM

## 2024-03-25 LAB — LIPID PANEL
Cholesterol: 162 mg/dL (ref 0–200)
HDL: 60.6 mg/dL (ref >39.00)
LDL Cholesterol: 81 mg/dL (ref 0–99)
NonHDL: 100.97
Total CHOL/HDL Ratio: 3
Triglycerides: 101 mg/dL (ref 0.0–149.0)
VLDL: 20.2 mg/dL (ref 0.0–40.0)

## 2024-03-25 MED ORDER — VALSARTAN 40 MG PO TABS: 40.0000 mg | ORAL_TABLET | Freq: Every day | ORAL | 1 refills | Status: AC

## 2024-03-25 MED ORDER — ROSUVASTATIN CALCIUM 5 MG PO TABS: 5.0000 mg | ORAL_TABLET | Freq: Every day | ORAL | 1 refills | Status: AC

## 2024-03-25 MED ORDER — AMLODIPINE BESYLATE 10 MG PO TABS: 10.0000 mg | ORAL_TABLET | Freq: Every day | ORAL | 1 refills | Status: DC

## 2024-03-25 MED ORDER — TRAZODONE HCL 50 MG PO TABS
50.0000 mg | ORAL_TABLET | Freq: Every evening | ORAL | 2 refills | Status: AC | PRN
Start: 2024-03-25 — End: ?

## 2024-03-25 NOTE — Patient Instructions (Signed)
 Magnesium glycinate or oxide start with 200 mg at bedtime

## 2024-03-25 NOTE — Progress Notes (Signed)
 Established Patient Office Visit  Subjective   Patient ID: Max [name], male    DOB: 1961-02-10  Age: 63 y.o. MRN: [id number]  Chief Complaint  Patient presents with   Medical Management of Chronic Issues    Patient states during last visit with NP treating low testosterone , his blood pressure was elevated, labs revealed DHEA was low and currently taking OTC supplement of 25mg  daily    HPI Discussed the use of AI scribe software for clinical note transcription with the patient, who gave verbal consent to proceed.  History of Present Illness   Max [name] is a 63 year old male who presents for a midyear follow-up visit.  He manages low testosterone  with self-administered testosterone  injections and is unsure of his current levels. He inquires about checking testosterone  levels and mentions taking 25 mg of DHEA for previously low levels.  He experiences sleep disturbances, which he attributes to working twelve-hour shifts, and is interested in resuming trazodone  for sleep issues.  He recalls a previous episode of elevated blood pressure related to high salt intake but believes his blood pressure is generally stable. His current medications include amlodipine , sertraline , and rosuvastatin . He has switched from a prescription stomach acid medication to over-the-counter options.  He has received his flu shot and an RSV vaccine this year.       Current Outpatient Medications  Medication Instructions   acetaminophen  (TYLENOL ) 500 mg, Every 8 hours PRN   amLODipine  (NORVASC ) 10 mg, Oral, Daily   Blood Pressure Monitoring (ADULT BLOOD PRESSURE CUFF LG) KIT 1 Device, Does not apply, Daily PRN   carboxymethylcellulose (REFRESH PLUS) 0.5 % SOLN 1 drop, 3 times daily PRN   cholecalciferol (VITAMIN D3) 1,000 Units, Daily   cyclobenzaprine  (FLEXERIL ) 5 mg, Oral, At bedtime PRN   diclofenac  Sodium (VOLTAREN ) 4 g, Topical, 4 times daily PRN   Prasterone, DHEA, (DHEA PO) Take by  mouth.   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     Objective:     BP 118/60   Pulse 73   Temp (!) 97.5 F (36.4 C) (Oral)   Ht 5' 8.5 (1.74 m)   Wt 190 lb 12.8 oz (86.5 kg)   SpO2 97%   BMI 28.59 kg/m    Physical Exam Vitals reviewed.  Constitutional:      Appearance: Normal appearance. He is well-groomed and normal weight.  Cardiovascular:     Rate and Rhythm: Normal rate and regular rhythm.     Heart sounds: S1 normal and S2 normal. No murmur heard. Pulmonary:     Effort: Pulmonary effort is normal.     Breath sounds: Normal breath sounds and air entry. No rales.  Musculoskeletal:     Right lower leg: No edema.     Left lower leg: No  edema.  Neurological:     General: No focal deficit present.     Mental Status: He is alert and oriented to person, place, and time.     Gait: Gait is intact.  Psychiatric:        Mood and Affect: Mood and affect normal.      No results found for any visits on 03/25/24.    The 10-year ASCVD risk score (Arnett DK, et al., 2019) is: 11.3%    Assessment & Plan:  Mixed hyperlipidemia -     Lipid panel; Future -     Rosuvastatin  Calcium ; Take 1 tablet (5 mg total) by mouth daily.  Dispense: 90 tablet; Refill: 1  Low testosterone  -     DHEA;  Future  Primary hypertension -     amLODIPine  Besylate; Take 1 tablet (10 mg total) by mouth daily.  Dispense: 90 tablet; Refill: 1 -     Valsartan ; Take 1 tablet (40 mg total) by mouth daily.  Dispense: 90 tablet; Refill: 1  Other insomnia -     traZODone  HCl; Take 1-2 tablets (50-100 mg total) by mouth at bedtime as needed.  Dispense: 30 tablet; Refill: 2   Assessment and Plan     Mixed hyperlipidemia Cholesterol levels were optimal last year. Current medication regimen is effective. - Ordered cholesterol blood test. - Continue rosuvastatin .  Essential hypertension Blood pressure is well-controlled at 118/60 mmHg. Previous elevated readings were likely due to pain and dietary factors. - Refilled amlodipine .  Insomnia Reports difficulty sleeping, likely due to working 12-hour shifts. Previously managed with trazodone . - Prescribed trazodone  for insomnia.  Low testosterone  on replacement therapy On self-administered testosterone  injections. Prefers home administration due to convenience. Testosterone  levels not checked today due to timing.  Low DHEA on supplementation On 25 mg DHEA supplementation due to low levels. Current status unknown. - Ordered DHEA level test.  Depression Currently on sertraline  with recent refill. - Continue sertraline .        Return in about 6 months (around 09/27/2024) for annual physical exam.    Heron CHRISTELLA Sharper, MD

## 2024-03-29 ENCOUNTER — Ambulatory Visit: Payer: Self-pay | Admitting: Family Medicine

## 2024-03-30 LAB — DHEA: DHEA: 155 ng/dL (ref 147–1760)

## 2024-05-14 ENCOUNTER — Other Ambulatory Visit: Payer: Self-pay | Admitting: Family Medicine

## 2024-05-14 DIAGNOSIS — I10 Essential (primary) hypertension: Secondary | ICD-10-CM

## 2024-06-09 ENCOUNTER — Ambulatory Visit (INDEPENDENT_AMBULATORY_CARE_PROVIDER_SITE_OTHER): Payer: Self-pay | Admitting: Family Medicine

## 2024-06-09 ENCOUNTER — Encounter: Payer: Self-pay | Admitting: Family Medicine

## 2024-06-09 VITALS — BP 124/78 | HR 75 | Temp 98.4°F | Wt 189.0 lb

## 2024-06-09 DIAGNOSIS — J40 Bronchitis, not specified as acute or chronic: Secondary | ICD-10-CM

## 2024-06-09 MED ORDER — AZITHROMYCIN 250 MG PO TABS: ORAL_TABLET | ORAL | 0 refills | Status: AC

## 2024-06-09 NOTE — Progress Notes (Signed)
" ° °  Subjective:    Patient ID: Max [name], male    DOB: 19-Jan-1961, 64 y.o.   MRN: [id number]  HPI Here for 4 days of stuffy head, PND, chest congestion and coughing up yellow sputum. No fever or SOB.    Review of Systems  Constitutional: Negative.   HENT:  Positive for congestion and postnasal drip. Negative for ear pain, sinus pain and sore throat.   Eyes: Negative.   Respiratory:  Positive for cough. Negative for shortness of breath and wheezing.        Objective:   Physical Exam Constitutional:      Appearance: Normal appearance.  HENT:     Right Ear: Tympanic membrane, ear canal and external ear normal.     Left Ear: Tympanic membrane, ear canal and external ear normal.     Nose: Nose normal.     Mouth/Throat:     Pharynx: Oropharynx is clear.  Eyes:     Conjunctiva/sclera: Conjunctivae normal.  Pulmonary:     Effort: Pulmonary effort is normal.     Breath sounds: Rhonchi present. No wheezing or rales.  Neurological:     Mental Status: He is alert.           Assessment & Plan:  Bronchitis, treat with a Zpack.  Garnette Olmsted, MD   "

## 2024-06-10 ENCOUNTER — Telehealth: Payer: Self-pay

## 2024-06-10 MED ORDER — HYDROCODONE BIT-HOMATROP MBR 5-1.5 MG/5ML PO SOLN: 5.0000 mL | ORAL | 0 refills | Status: AC | PRN

## 2024-06-10 NOTE — Telephone Encounter (Signed)
 I sent in a RX for Hycodan syrup

## 2024-06-10 NOTE — Telephone Encounter (Signed)
 Pt.notified

## 2024-06-10 NOTE — Telephone Encounter (Signed)
 Copied from CRM #[id number]. Topic: Clinical - Medical Advice >> Jun 10, 2024 10:38 AM Laymon HERO wrote: Reason for CRM: Patient was seen 1/28 has a dry cough. Wanting to know if something can be called in to help with that. Please contact patient

## 2024-06-10 NOTE — Addendum Note (Signed)
 Addended by: JOHNNY SENIOR A on: 06/10/2024 12:41 PM   Modules accepted: Orders
